# Patient Record
Sex: Female | Born: 1955 | Race: White | Hispanic: No | Smoking: Former smoker
Health system: Southern US, Community
[De-identification: ages and names within clinical notes are randomized; demographics above are authoritative.]

## PROBLEM LIST (undated history)

## (undated) DIAGNOSIS — M1990 Unspecified osteoarthritis, unspecified site: Secondary | ICD-10-CM

## (undated) DIAGNOSIS — N184 Chronic kidney disease, stage 4 (severe): Secondary | ICD-10-CM

## (undated) DIAGNOSIS — G709 Myoneural disorder, unspecified: Secondary | ICD-10-CM

## (undated) DIAGNOSIS — J189 Pneumonia, unspecified organism: Secondary | ICD-10-CM

## (undated) DIAGNOSIS — K219 Gastro-esophageal reflux disease without esophagitis: Secondary | ICD-10-CM

## (undated) DIAGNOSIS — J302 Other seasonal allergic rhinitis: Secondary | ICD-10-CM

## (undated) DIAGNOSIS — G2581 Restless legs syndrome: Secondary | ICD-10-CM

## (undated) DIAGNOSIS — J45909 Unspecified asthma, uncomplicated: Secondary | ICD-10-CM

## (undated) DIAGNOSIS — I209 Angina pectoris, unspecified: Secondary | ICD-10-CM

## (undated) DIAGNOSIS — G4730 Sleep apnea, unspecified: Secondary | ICD-10-CM

## (undated) DIAGNOSIS — G8929 Other chronic pain: Secondary | ICD-10-CM

## (undated) DIAGNOSIS — Z720 Tobacco use: Secondary | ICD-10-CM

## (undated) DIAGNOSIS — E538 Deficiency of other specified B group vitamins: Secondary | ICD-10-CM

## (undated) DIAGNOSIS — R233 Spontaneous ecchymoses: Secondary | ICD-10-CM

## (undated) DIAGNOSIS — B009 Herpesviral infection, unspecified: Secondary | ICD-10-CM

## (undated) DIAGNOSIS — J309 Allergic rhinitis, unspecified: Secondary | ICD-10-CM

## (undated) DIAGNOSIS — IMO0002 Reserved for concepts with insufficient information to code with codable children: Secondary | ICD-10-CM

## (undated) DIAGNOSIS — R238 Other skin changes: Secondary | ICD-10-CM

## (undated) DIAGNOSIS — G4700 Insomnia, unspecified: Secondary | ICD-10-CM

## (undated) DIAGNOSIS — R519 Headache, unspecified: Secondary | ICD-10-CM

## (undated) DIAGNOSIS — Z9981 Dependence on supplemental oxygen: Secondary | ICD-10-CM

## (undated) DIAGNOSIS — I2510 Atherosclerotic heart disease of native coronary artery without angina pectoris: Secondary | ICD-10-CM

## (undated) DIAGNOSIS — J449 Chronic obstructive pulmonary disease, unspecified: Secondary | ICD-10-CM

## (undated) DIAGNOSIS — I10 Essential (primary) hypertension: Secondary | ICD-10-CM

## (undated) DIAGNOSIS — F329 Major depressive disorder, single episode, unspecified: Secondary | ICD-10-CM

## (undated) DIAGNOSIS — I340 Nonrheumatic mitral (valve) insufficiency: Secondary | ICD-10-CM

## (undated) DIAGNOSIS — F32A Depression, unspecified: Secondary | ICD-10-CM

## (undated) DIAGNOSIS — K2270 Barrett's esophagus without dysplasia: Secondary | ICD-10-CM

## (undated) DIAGNOSIS — J42 Unspecified chronic bronchitis: Secondary | ICD-10-CM

## (undated) DIAGNOSIS — F419 Anxiety disorder, unspecified: Secondary | ICD-10-CM

## (undated) DIAGNOSIS — M810 Age-related osteoporosis without current pathological fracture: Secondary | ICD-10-CM

## (undated) DIAGNOSIS — K2970 Gastritis, unspecified, without bleeding: Secondary | ICD-10-CM

## (undated) DIAGNOSIS — E669 Obesity, unspecified: Secondary | ICD-10-CM

## (undated) DIAGNOSIS — D649 Anemia, unspecified: Secondary | ICD-10-CM

## (undated) DIAGNOSIS — R0602 Shortness of breath: Secondary | ICD-10-CM

## (undated) DIAGNOSIS — K5903 Drug induced constipation: Secondary | ICD-10-CM

## (undated) DIAGNOSIS — R51 Headache: Secondary | ICD-10-CM

## (undated) DIAGNOSIS — R9439 Abnormal result of other cardiovascular function study: Secondary | ICD-10-CM

## (undated) HISTORY — PX: BRAIN SURGERY: SHX531

## (undated) HISTORY — PX: TONSILLECTOMY: SUR1361

## (undated) HISTORY — PX: LAPAROSCOPIC CHOLECYSTECTOMY: SUR755

## (undated) HISTORY — PX: ESOPHAGOGASTRODUODENOSCOPY (EGD) WITH ESOPHAGEAL DILATION: SHX5812

## (undated) HISTORY — PX: COLONOSCOPY: SHX174

## (undated) HISTORY — PX: CARPAL TUNNEL RELEASE: SHX101

## (undated) HISTORY — PX: FRACTURE SURGERY: SHX138

## (undated) HISTORY — DX: Essential (primary) hypertension: I10

## (undated) HISTORY — PX: ESOPHAGOGASTRODUODENOSCOPY ENDOSCOPY: SHX5814

## (undated) HISTORY — DX: Abnormal result of other cardiovascular function study: R94.39

## (undated) HISTORY — PX: CYSTOSCOPY VAGINOSCOPY W/ VAGINAL DILATION: SHX1422

## (undated) HISTORY — PX: CATARACT EXTRACTION W/ INTRAOCULAR LENS  IMPLANT, BILATERAL: SHX1307

## (undated) HISTORY — PX: BACK SURGERY: SHX140

---

## 1984-10-21 HISTORY — PX: ABDOMINAL HYSTERECTOMY: SHX81

## 2007-10-22 HISTORY — PX: CARDIAC CATHETERIZATION: SHX172

## 2008-10-21 HISTORY — PX: ANTERIOR CERVICAL DECOMP/DISCECTOMY FUSION: SHX1161

## 2008-10-21 HISTORY — PX: ANKLE FRACTURE SURGERY: SHX122

## 2013-03-26 ENCOUNTER — Other Ambulatory Visit: Payer: Self-pay | Admitting: *Deleted

## 2013-03-26 DIAGNOSIS — Z01818 Encounter for other preprocedural examination: Secondary | ICD-10-CM

## 2013-03-29 ENCOUNTER — Ambulatory Visit (HOSPITAL_COMMUNITY)
Admission: RE | Admit: 2013-03-29 | Discharge: 2013-03-29 | Disposition: A | Discharge status: Home / Self Care | Payer: Medicare Other | Source: Ambulatory Visit | Attending: Cardiovascular Disease | Admitting: Cardiovascular Disease

## 2013-03-29 ENCOUNTER — Encounter (HOSPITAL_COMMUNITY): Payer: Self-pay | Admitting: Cardiology

## 2013-03-29 ENCOUNTER — Encounter (HOSPITAL_COMMUNITY)
Admission: RE | Disposition: A | Discharge status: Home / Self Care | Payer: Self-pay | Source: Ambulatory Visit | Attending: Cardiovascular Disease

## 2013-03-29 DIAGNOSIS — E119 Type 2 diabetes mellitus without complications: Secondary | ICD-10-CM

## 2013-03-29 DIAGNOSIS — Z91018 Allergy to other foods: Secondary | ICD-10-CM | POA: Insufficient documentation

## 2013-03-29 DIAGNOSIS — Z8249 Family history of ischemic heart disease and other diseases of the circulatory system: Secondary | ICD-10-CM

## 2013-03-29 DIAGNOSIS — Z87891 Personal history of nicotine dependence: Secondary | ICD-10-CM

## 2013-03-29 DIAGNOSIS — R0789 Other chest pain: Secondary | ICD-10-CM | POA: Insufficient documentation

## 2013-03-29 DIAGNOSIS — Z883 Allergy status to other anti-infective agents status: Secondary | ICD-10-CM | POA: Insufficient documentation

## 2013-03-29 DIAGNOSIS — Z6835 Body mass index (BMI) 35.0-35.9, adult: Secondary | ICD-10-CM | POA: Insufficient documentation

## 2013-03-29 DIAGNOSIS — R079 Chest pain, unspecified: Secondary | ICD-10-CM

## 2013-03-29 DIAGNOSIS — E669 Obesity, unspecified: Secondary | ICD-10-CM

## 2013-03-29 DIAGNOSIS — Z79899 Other long term (current) drug therapy: Secondary | ICD-10-CM | POA: Insufficient documentation

## 2013-03-29 DIAGNOSIS — Z01818 Encounter for other preprocedural examination: Secondary | ICD-10-CM

## 2013-03-29 DIAGNOSIS — I200 Unstable angina: Secondary | ICD-10-CM

## 2013-03-29 HISTORY — PX: LEFT HEART CATHETERIZATION WITH CORONARY ANGIOGRAM: SHX5451

## 2013-03-29 LAB — CBC
HCT: 37.9 % (ref 36.0–46.0)
Hemoglobin: 12.6 g/dL (ref 12.0–15.0)
MCH: 29.6 pg (ref 26.0–34.0)
MCV: 89 fL (ref 78.0–100.0)
RBC: 4.26 MIL/uL (ref 3.87–5.11)

## 2013-03-29 LAB — BASIC METABOLIC PANEL
CO2: 27 mEq/L (ref 19–32)
Chloride: 103 mEq/L (ref 96–112)
GFR calc non Af Amer: >90 mL/min (ref >90)
Glucose, Bld: 97 mg/dL (ref 70–99)
Potassium: 3.9 mEq/L (ref 3.5–5.1)
Sodium: 138 mEq/L (ref 135–145)

## 2013-03-29 LAB — PROTIME-INR: Prothrombin Time: 11.7 seconds (ref 11.6–15.2)

## 2013-03-29 SURGERY — LEFT HEART CATHETERIZATION WITH CORONARY ANGIOGRAM
Anesthesia: LOCAL

## 2013-03-29 MED ORDER — HEPARIN (PORCINE) IN NACL 2-0.9 UNIT/ML-% IJ SOLN
INTRAMUSCULAR | Status: AC
  Filled 2013-03-29: qty 1000

## 2013-03-29 MED ORDER — HYDRALAZINE HCL 20 MG/ML IJ SOLN
INTRAMUSCULAR | Status: AC
  Filled 2013-03-29: qty 1

## 2013-03-29 MED ORDER — SODIUM CHLORIDE 0.9 % IV SOLN: INTRAVENOUS | Status: AC

## 2013-03-29 MED ORDER — ONDANSETRON HCL 4 MG/2ML IJ SOLN: 4.0000 mg | Freq: Four times a day (QID) | INTRAMUSCULAR | Status: DC | PRN

## 2013-03-29 MED ORDER — ASPIRIN 81 MG PO CHEW
CHEWABLE_TABLET | ORAL | Status: AC
  Filled 2013-03-29: qty 4

## 2013-03-29 MED ORDER — SODIUM CHLORIDE 0.9 % IJ SOLN: 3.0000 mL | INTRAMUSCULAR | Status: DC | PRN

## 2013-03-29 MED ORDER — HYDRALAZINE HCL 20 MG/ML IJ SOLN: 10.0000 mg | INTRAMUSCULAR | Status: DC

## 2013-03-29 MED ORDER — DIAZEPAM 5 MG PO TABS
5.0000 mg | ORAL_TABLET | ORAL | Status: AC
  Administered 2013-03-29: 5 mg via ORAL

## 2013-03-29 MED ORDER — LIDOCAINE HCL (PF) 1 % IJ SOLN
INTRAMUSCULAR | Status: AC
  Filled 2013-03-29: qty 30

## 2013-03-29 MED ORDER — SODIUM CHLORIDE 0.9 % IV SOLN
INTRAVENOUS | Status: DC
  Administered 2013-03-29: 13:00:00 via INTRAVENOUS

## 2013-03-29 MED ORDER — MORPHINE SULFATE 2 MG/ML IJ SOLN
1.0000 mg | INTRAMUSCULAR | Status: DC | PRN
  Administered 2013-03-29: 1 mg via INTRAVENOUS

## 2013-03-29 MED ORDER — DIAZEPAM 5 MG PO TABS
ORAL_TABLET | ORAL | Status: AC
  Filled 2013-03-29: qty 1

## 2013-03-29 MED ORDER — ACETAMINOPHEN 325 MG PO TABS: 650.0000 mg | ORAL_TABLET | ORAL | Status: DC | PRN

## 2013-03-29 MED ORDER — MORPHINE SULFATE 2 MG/ML IJ SOLN
INTRAMUSCULAR | Status: AC
  Filled 2013-03-29: qty 1

## 2013-03-29 NOTE — CV Procedure (Signed)
Shannon Morse is a 57 y.o. female    [id number] LOCATION:  FACILITY: [facility]  PHYSICIAN: Nanetta Batty, M.D. Feb 14, 1956   DATE OF PROCEDURE:  03/29/2013  DATE OF DISCHARGE:   CARDIAC CATHETERIZATION     History obtained from chart review.the patient is a 57 y/o Caucasian female, followed at [facility] by Dr. Hanley Hays, who was referred to [facility] for a diagnostic heat catheterization, in the setting of unstable angina. Her medical history is significant for obesity, borderline diabetes and a 40 year history of heavy tobacco use. The patient quit 1 year ago and before quitting, she was up to 5 ppd. She also has a strong family history of CAD. Both parents had MIs late in age, and she has one sister who had a MI in her early 67s. The patient reports that for the past 2 months she has had substernal chest pain that wakes her from her sleep. The pain is pressure like with radiation to her back. The episodes sometimes last up to 1 hour in duration. She has 2-3 episodes, on average, per week. She also notes exertional shortness of breath.     PROCEDURE DESCRIPTION:    The patient was brought to the second floor [place] [facility]iac cath lab in the postabsorptive state. She waspremedicated with valium 5 mg PO. Her right groin  was prepped and shaved in usual sterile fashion. Xylocaine 1% was used for local anesthesia. A 5 French sheath was inserted into the RCFA  artery using standard Seldinger technique. 5 french right and left Judkins diagnostic catheters were used as well as a 5 french pigtail. Visapaque dye was used for the entirety of the case. Retrograde aortic, left ventricular and pull back pressures were recorded.  HEMODYNAMICS:    AO SYSTOLIC/AO DIASTOLIC: 174/85   LV SYSTOLIC/LV DIASTOLIC: 173/12  ANGIOGRAPHIC RESULTS:   1. Left main; nl  2. LAD; nl 3. Left circumflex; nl.  4. Right coronary artery; nl 5. Left ventriculography; RAO left ventriculogram was performed using  25  mL of Visipaque dye at 12 mL/second. The overall LVEF estimated  60 %  Without wall motion abnormalities  IMPRESSION:Nl cath. Non cardiac CP. MYNX closure. Home today as an OP. ROV with Dr. Leamon Arnt.  Runell Gess MD, [facility] 03/29/2013 3:23 PM

## 2013-03-29 NOTE — H&P (Signed)
   Pt was reexamined and existing H & P reviewed. No changes found.  Runell Gess, MD [facility] 03/29/2013 2:35 PM

## 2013-03-29 NOTE — H&P (Signed)
Chief Complaint: Unstable Angina  HPI: the patient is a 57 y/o Caucasian female, followed at [facility] by Dr. Hanley Hays, who was referred to [facility] for a diagnostic heat catheterization, in the setting of unstable angina. Her medical history is significant for obesity, borderline diabetes and a 40 year history of heavy tobacco use. The patient quit 1 year ago and before quitting, she was up to 5 ppd. She also has a strong family history of CAD. Both parents had MIs late in age, and she has one sister who had a MI in her early 37s. The patient reports that for the past 2 months she has had substernal chest pain that wakes her from her sleep. The pain is pressure like with radiation to her back. The episodes sometimes last up to 1 hour in duration. She has 2-3 episodes, on average, per week. She also notes exertional shortness of breath. No other associated symptoms. She denies orthopnea, PND, syncope/presyncope. No recent fever, chills, n/v, melena, hematochezia or hematuria. She is currently CP free.   No past medical history on file.  No past surgical history on file.  Family History  Problem Relation Age of Onset  . Coronary artery disease Mother 46    MI  . Coronary artery disease Father     MI  . Coronary artery disease Sister 38    MI  . Heart failure Father    Social History:  reports that she has quit smoking. Her smoking use included Cigarettes. She smoked 0.00 packs per day for 40 years. She does not have any smokeless tobacco history on file. Her alcohol and drug histories are not on file.  Allergies:  Allergies  Allergen Reactions  . Avelox (Moxifloxacin)   . Okra   . Strawberry     Medications Prior to Admission  Medication Sig Dispense Refill  . albuterol (PROVENTIL HFA;VENTOLIN HFA) 108 (90 BASE) MCG/ACT inhaler Inhale 2 puffs into the lungs every 6 (six) hours as needed for wheezing or shortness of breath.      Marland Kitchen albuterol (PROVENTIL) (2.5 MG/3ML) 0.083% nebulizer  solution Take 2.5 mg by nebulization every 6 (six) hours as needed for wheezing or shortness of breath.      . ALPRAZolam (XANAX) 1 MG tablet Take 1 mg by mouth 4 (four) times daily as needed for anxiety.      . budesonide-formoterol (SYMBICORT) 160-4.5 MCG/ACT inhaler Inhale 2 puffs into the lungs 2 (two) times daily.      . Cholecalciferol (VITAMIN D PO) Take 1 tablet by mouth daily.      . Cyanocobalamin (VITAMIN B-12 PO) Take 1 tablet by mouth daily.      Marland Kitchen dexlansoprazole (DEXILANT) 60 MG capsule Take 60 mg by mouth 2 (two) times daily.      Marland Kitchen gabapentin (NEURONTIN) 300 MG capsule Take 600 mg by mouth at bedtime.      Marland Kitchen oxyCODONE (ROXICODONE) 15 MG immediate release tablet Take 15 mg by mouth every 6 (six) hours as needed for pain.      . OxyCODONE HCl ER (OXYCONTIN) 60 MG T12A Take 60 mg by mouth 3 (three) times daily.      . pramipexole (MIRAPEX) 0.75 MG tablet Take 0.75 mg by mouth at bedtime.        Results for orders placed during the hospital encounter of 03/29/13 (from the past 48 hour(s))  CBC     Status: None   Collection Time    03/29/13 12:44 PM  Result Value Range   WBC 6.8  4.0 - 10.5 K/uL   RBC 4.26  3.87 - 5.11 MIL/uL   Hemoglobin 12.6  12.0 - 15.0 g/dL   HCT 16.1  09.6 - 04.5 %   MCV 89.0  78.0 - 100.0 fL   MCH 29.6  26.0 - 34.0 pg   MCHC 33.2  30.0 - 36.0 g/dL   RDW 40.9  81.1 - 91.4 %   Platelets 269  150 - 400 K/uL  PROTIME-INR     Status: None   Collection Time    03/29/13 12:44 PM      Result Value Range   Prothrombin Time 11.7  11.6 - 15.2 seconds   INR 0.86  0.00 - 1.49   No results found.  Review of Systems  Constitutional: Negative for fever, chills, malaise/fatigue and diaphoresis.  HENT: Negative for ear pain, congestion, sore throat and neck pain. Tinnitus: chronic.   Eyes: Negative for blurred vision, double vision and photophobia.  Respiratory: Positive for shortness of breath (only on exertion) and wheezing. Negative for cough.    Cardiovascular: Positive for chest pain. Negative for palpitations, orthopnea, claudication, leg swelling and PND.  Gastrointestinal: Negative for nausea, vomiting, abdominal pain, diarrhea, constipation, blood in stool and melena.  Genitourinary: Negative for dysuria, urgency, frequency and hematuria.  Musculoskeletal: Positive for back pain. Negative for myalgias and falls.  Skin: Negative for itching and rash.  Neurological: Positive for headaches. Negative for dizziness, focal weakness, seizures, loss of consciousness and weakness.    Blood pressure 140/74, pulse 84, temperature 98.7 F (37.1 C), temperature source Oral, resp. rate 18, height 5\' 4"  (1.626 m), weight 205 lb (92.987 kg), SpO2 92.00%. Physical Exam  Constitutional: She is oriented to person, place, and time. She appears well-developed and well-nourished. No distress.  HENT:  Head: Normocephalic and atraumatic.  Eyes: Conjunctivae are normal. Pupils are equal, round, and reactive to light.  Neck: No JVD present. Carotid bruit is not present. No tracheal deviation present. No thyromegaly present.  Cardiovascular: Normal rate, regular rhythm, normal heart sounds and intact distal pulses.  Exam reveals no gallop and no friction rub.   No murmur heard. Pulses:      Radial pulses are 2+ on the right side, and 2+ on the left side.       Dorsalis pedis pulses are 1+ on the right side, and 1+ on the left side.  Respiratory: Effort normal. No respiratory distress. She has wheezes (bilateral expiratory). She has no rales. She exhibits no tenderness.  GI: Soft. Bowel sounds are normal. She exhibits no distension and no mass. There is no tenderness.  Musculoskeletal: Normal range of motion. She exhibits no edema.  Lymphadenopathy:    She has no cervical adenopathy.  Neurological: She is alert and oriented to person, place, and time.  Skin: Skin is warm and dry. She is not diaphoretic.  Psychiatric: She has a normal mood and affect.  Her behavior is normal.     Assessment/Plan Principal Problem:   Unstable angina Active Problems:   DM (diabetes mellitus)   Obesity   History of tobacco abuse  Plan: Pt presents for elective OP cardiac cath. Exam benign. Vitals stable. Proceed with cath to be performed by Dr. Allyson Sabal.   Allayne Butcher, PA-C 03/29/2013, 1:40 PM  Agree with note written by Boyce Medici  PAC  + CRF, symptoms c/w [place]. Referred for cath.  Runell Gess 03/29/2013 3:22 PM

## 2013-03-30 LAB — GLUCOSE, CAPILLARY: Glucose-Capillary: 79 mg/dL (ref 70–99)

## 2013-07-13 ENCOUNTER — Encounter: Payer: Self-pay | Admitting: Cardiovascular Disease

## 2013-07-13 ENCOUNTER — Other Ambulatory Visit: Payer: Self-pay | Admitting: *Deleted

## 2013-07-13 DIAGNOSIS — I059 Rheumatic mitral valve disease, unspecified: Secondary | ICD-10-CM

## 2013-07-19 ENCOUNTER — Encounter (HOSPITAL_COMMUNITY)
Admission: RE | Disposition: A | Discharge status: Home / Self Care | Payer: Self-pay | Source: Ambulatory Visit | Attending: Cardiovascular Disease

## 2013-07-19 ENCOUNTER — Ambulatory Visit (HOSPITAL_COMMUNITY)
Admission: RE | Admit: 2013-07-19 | Discharge: 2013-07-19 | Disposition: A | Discharge status: Home / Self Care | Payer: Medicare Other | Source: Ambulatory Visit | Attending: Cardiovascular Disease | Admitting: Cardiovascular Disease

## 2013-07-19 ENCOUNTER — Encounter (HOSPITAL_COMMUNITY): Payer: Self-pay | Admitting: *Deleted

## 2013-07-19 DIAGNOSIS — E669 Obesity, unspecified: Secondary | ICD-10-CM | POA: Insufficient documentation

## 2013-07-19 DIAGNOSIS — Z79899 Other long term (current) drug therapy: Secondary | ICD-10-CM | POA: Insufficient documentation

## 2013-07-19 DIAGNOSIS — E119 Type 2 diabetes mellitus without complications: Secondary | ICD-10-CM | POA: Insufficient documentation

## 2013-07-19 DIAGNOSIS — R0609 Other forms of dyspnea: Secondary | ICD-10-CM | POA: Insufficient documentation

## 2013-07-19 DIAGNOSIS — Z8249 Family history of ischemic heart disease and other diseases of the circulatory system: Secondary | ICD-10-CM

## 2013-07-19 DIAGNOSIS — Z87891 Personal history of nicotine dependence: Secondary | ICD-10-CM | POA: Insufficient documentation

## 2013-07-19 DIAGNOSIS — I059 Rheumatic mitral valve disease, unspecified: Secondary | ICD-10-CM

## 2013-07-19 DIAGNOSIS — R0989 Other specified symptoms and signs involving the circulatory and respiratory systems: Secondary | ICD-10-CM | POA: Insufficient documentation

## 2013-07-19 HISTORY — DX: Nonrheumatic mitral (valve) insufficiency: I34.0

## 2013-07-19 HISTORY — DX: Restless legs syndrome: G25.81

## 2013-07-19 HISTORY — DX: Gastritis, unspecified, without bleeding: K29.70

## 2013-07-19 HISTORY — DX: Chronic obstructive pulmonary disease, unspecified: J44.9

## 2013-07-19 HISTORY — DX: Other chronic pain: G89.29

## 2013-07-19 HISTORY — DX: Deficiency of other specified B group vitamins: E53.8

## 2013-07-19 HISTORY — DX: Unspecified osteoarthritis, unspecified site: M19.90

## 2013-07-19 HISTORY — DX: Herpesviral infection, unspecified: B00.9

## 2013-07-19 HISTORY — DX: Tobacco use: Z72.0

## 2013-07-19 HISTORY — DX: Atherosclerotic heart disease of native coronary artery without angina pectoris: I25.10

## 2013-07-19 HISTORY — DX: Age-related osteoporosis without current pathological fracture: M81.0

## 2013-07-19 HISTORY — DX: Major depressive disorder, single episode, unspecified: F32.9

## 2013-07-19 HISTORY — DX: Angina pectoris, unspecified: I20.9

## 2013-07-19 HISTORY — DX: Sleep apnea, unspecified: G47.30

## 2013-07-19 HISTORY — DX: Depression, unspecified: F32.A

## 2013-07-19 HISTORY — DX: Insomnia, unspecified: G47.00

## 2013-07-19 HISTORY — DX: Reserved for concepts with insufficient information to code with codable children: IMO0002

## 2013-07-19 HISTORY — DX: Anemia, unspecified: D64.9

## 2013-07-19 HISTORY — DX: Allergic rhinitis, unspecified: J30.9

## 2013-07-19 HISTORY — PX: TEE WITHOUT CARDIOVERSION: SHX5443

## 2013-07-19 HISTORY — DX: Obesity, unspecified: E66.9

## 2013-07-19 HISTORY — DX: Gastro-esophageal reflux disease without esophagitis: K21.9

## 2013-07-19 HISTORY — DX: Pneumonia, unspecified organism: J18.9

## 2013-07-19 HISTORY — DX: Anxiety disorder, unspecified: F41.9

## 2013-07-19 HISTORY — DX: Barrett's esophagus without dysplasia: K22.70

## 2013-07-19 SURGERY — ECHOCARDIOGRAM, TRANSESOPHAGEAL
Anesthesia: Moderate Sedation

## 2013-07-19 MED ORDER — BUTAMBEN-TETRACAINE-BENZOCAINE 2-2-14 % EX AERO
INHALATION_SPRAY | CUTANEOUS | Status: DC | PRN
  Administered 2013-07-19: 2 via TOPICAL

## 2013-07-19 MED ORDER — MIDAZOLAM HCL 10 MG/2ML IJ SOLN
INTRAMUSCULAR | Status: DC | PRN
  Administered 2013-07-19: 2 mg via INTRAVENOUS
  Administered 2013-07-19: 1 mg via INTRAVENOUS

## 2013-07-19 MED ORDER — FENTANYL CITRATE 0.05 MG/ML IJ SOLN
INTRAMUSCULAR | Status: DC | PRN
  Administered 2013-07-19 (×2): 25 ug via INTRAVENOUS

## 2013-07-19 MED ORDER — MIDAZOLAM HCL 5 MG/ML IJ SOLN
INTRAMUSCULAR | Status: AC
  Filled 2013-07-19: qty 2

## 2013-07-19 MED ORDER — FENTANYL CITRATE 0.05 MG/ML IJ SOLN
INTRAMUSCULAR | Status: AC
  Filled 2013-07-19: qty 2

## 2013-07-19 MED ORDER — SODIUM CHLORIDE 0.9 % IV SOLN
INTRAVENOUS | Status: DC
  Administered 2013-07-19: 13:00:00 via INTRAVENOUS

## 2013-07-19 NOTE — H&P (Signed)
HPI: the patient is a 57 y/o Caucasian female, followed at [facility] by Dr. Hanley Hays, who was referred to [facility] for a diagnostic heart catheterization in June, in the setting of unstable angina, but had normal coronaries. She has unexplained dyspnea and is referred for TEE for mitral insufficiency evaluation.. Her medical history is significant for obesity, borderline diabetes and a 40 year history of heavy tobacco use. The patient quit 1 year ago and before quitting, she was up to 5 ppd. She also has a strong family history of CAD. Both parents had MIs late in age, and she has one sister who had a MI in her early 39s. The patient reports that for the past 2 months she has had substernal chest pain that wakes her from her sleep. She also notes exertional shortness of breath. No other associated symptoms. She denies orthopnea, PND, syncope/presyncope. No recent fever, chills, n/v, melena, hematochezia or hematuria. She is currently CP free.  No other past medical history.  No other past surgical history.  Family History   Problem  Relation  Age of Onset   .  Coronary artery disease  Mother  67     MI   .  Coronary artery disease  Father      MI   .  Coronary artery disease  Sister  2     MI   .  Heart failure  Father    Social History: reports that she has quit smoking. Her smoking use included Cigarettes. She smoked 0.00 packs per day for 40 years. She does not have any smokeless tobacco history on file. Her alcohol and drug histories are not on file.  Allergies:  Allergies   Allergen  Reactions   .  Avelox (Moxifloxacin)    .  Okra    .  Strawberry     Medications Prior to Admission   Medication  Sig  Dispense  Refill   .  albuterol (PROVENTIL HFA;VENTOLIN HFA) 108 (90 BASE) MCG/ACT inhaler  Inhale 2 puffs into the lungs every 6 (six) hours as needed for wheezing or shortness of breath.     Marland Kitchen  albuterol (PROVENTIL) (2.5 MG/3ML) 0.083% nebulizer solution  Take 2.5 mg by nebulization every  6 (six) hours as needed for wheezing or shortness of breath.     .  ALPRAZolam (XANAX) 1 MG tablet  Take 1 mg by mouth 4 (four) times daily as needed for anxiety.     .  budesonide-formoterol (SYMBICORT) 160-4.5 MCG/ACT inhaler  Inhale 2 puffs into the lungs 2 (two) times daily.     .  Cholecalciferol (VITAMIN D PO)  Take 1 tablet by mouth daily.     .  Cyanocobalamin (VITAMIN B-12 PO)  Take 1 tablet by mouth daily.     Marland Kitchen  dexlansoprazole (DEXILANT) 60 MG capsule  Take 60 mg by mouth 2 (two) times daily.     Marland Kitchen  gabapentin (NEURONTIN) 300 MG capsule  Take 600 mg by mouth at bedtime.     Marland Kitchen  oxyCODONE (ROXICODONE) 15 MG immediate release tablet  Take 15 mg by mouth every 6 (six) hours as needed for pain.     .  OxyCODONE HCl ER (OXYCONTIN) 60 MG T12A  Take 60 mg by mouth 3 (three) times daily.     .  pramipexole (MIRAPEX) 0.75 MG tablet  Take 0.75 mg by mouth at bedtime.      Results for orders placed during the hospital encounter of 03/29/13 (  from the past 48 hour(s))   CBC Status: None    Collection Time    03/29/13 12:44 PM   Result  Value  Range    WBC  6.8  4.0 - 10.5 K/uL    RBC  4.26  3.87 - 5.11 MIL/uL    Hemoglobin  12.6  12.0 - 15.0 g/dL    HCT  16.1  09.6 - 04.5 %    MCV  89.0  78.0 - 100.0 fL    MCH  29.6  26.0 - 34.0 pg    MCHC  33.2  30.0 - 36.0 g/dL    RDW  40.9  81.1 - 91.4 %    Platelets  269  150 - 400 K/uL   PROTIME-INR Status: None    Collection Time    03/29/13 12:44 PM   Result  Value  Range    Prothrombin Time  11.7  11.6 - 15.2 seconds    INR  0.86  0.00 - 1.49   No results found.  Review of Systems  Constitutional: Negative for fever, chills, malaise/fatigue and diaphoresis.  HENT: Negative for ear pain, congestion, sore throat and neck pain. Tinnitus: chronic.  Eyes: Negative for blurred vision, double vision and photophobia.  Respiratory: Positive for shortness of breath (only on exertion) and wheezing. Negative for cough.  Cardiovascular: Positive for  chest pain. Negative for palpitations, orthopnea, claudication, leg swelling and PND.  Gastrointestinal: Negative for nausea, vomiting, abdominal pain, diarrhea, constipation, blood in stool and melena.  Genitourinary: Negative for dysuria, urgency, frequency and hematuria.  Musculoskeletal: Positive for back pain. Negative for myalgias and falls.  Skin: Negative for itching and rash.  Neurological: Positive for headaches. Negative for dizziness, focal weakness, seizures, loss of consciousness and weakness.  Blood pressure 140/74, pulse 84, temperature 98.7 F (37.1 C), temperature source Oral, resp. rate 18, height 5\' 4"  (1.626 m), weight 205 lb (92.987 kg), SpO2 92.00%.  Physical Exam  Constitutional: She is oriented to person, place, and time. She appears well-developed and well-nourished. No distress.  HENT:  Head: Normocephalic and atraumatic.  Eyes: Conjunctivae are normal. Pupils are equal, round, and reactive to light.  Neck: No JVD present. Carotid bruit is not present. No tracheal deviation present. No thyromegaly present.  Cardiovascular: Normal rate, regular rhythm, normal heart sounds and intact distal pulses. Exam reveals no gallop and no friction rub.  No murmur heard.  Pulses:  Radial pulses are 2+ on the right side, and 2+ on the left side.  Dorsalis pedis pulses are 1+ on the right side, and 1+ on the left side.  Respiratory: Effort normal. No respiratory distress. She has wheezes (bilateral expiratory). She has no rales. She exhibits no tenderness.  GI: Soft. Bowel sounds are normal. She exhibits no distension and no mass. There is no tenderness.  Musculoskeletal: Normal range of motion. She exhibits no edema.  Lymphadenopathy:  She has no cervical adenopathy.  Neurological: She is alert and oriented to person, place, and time.  Skin: Skin is warm and dry. She is not diaphoretic.  Psychiatric: She has a normal mood and affect. Her behavior is normal.  Assessment/Plan   Principal Problem:  Mitral insufficiency Active Problems:  DM (diabetes mellitus)  Obesity  History of tobacco abuse  Plan: TEE This procedure has been fully reviewed with the patient and written informed consent has been obtained.   Runell Gess  03/29/2013  3:22 PM

## 2013-07-19 NOTE — Progress Notes (Signed)
*  PRELIMINARY RESULTS* Echocardiogram TEE has been performed.  Jeryl Columbia 07/19/2013, 2:20 PM

## 2013-07-19 NOTE — Op Note (Signed)
INDICATIONS: mitral insufficiency  PROCEDURE:   Informed consent was obtained prior to the procedure. The risks, benefits and alternatives for the procedure were discussed and the patient comprehended these risks.  Risks include, but are not limited to, cough, sore throat, vomiting, nausea, somnolence, esophageal and stomach trauma or perforation, bleeding, low blood pressure, aspiration, pneumonia, infection, trauma to the teeth and death.    After a procedural time-out, the oropharynx was anesthetized with 20% benzocaine spray. The patient was given 3 mg versed and 50 mcg fentanyl for moderate sedation.   The transesophageal probe was inserted in the esophagus and stomach without difficulty and multiple views were obtained.  The patient was kept under observation until the patient left the procedure room.  The patient left the procedure room in stable condition.   Agitated microbubble saline contrast was not administered.  COMPLICATIONS:    There were no immediate complications.  FINDINGS:  Moderate MR, central jet, ERO 0.15 cm sq. Otherwise unremarkable study, normal LVEF   RECOMMENDATIONS:   F/U with Dr. Hanley Hays  Time Spent Directly with the Patient:  30 minutes   Shannon Morse 07/19/2013, 1:54 PM

## 2013-07-20 ENCOUNTER — Encounter (HOSPITAL_COMMUNITY): Payer: Self-pay | Admitting: Cardiovascular Disease

## 2013-09-15 ENCOUNTER — Other Ambulatory Visit: Payer: Self-pay | Admitting: Neurosurgery

## 2013-09-22 ENCOUNTER — Encounter (HOSPITAL_COMMUNITY): Payer: Self-pay | Admitting: Pharmacy Technician

## 2013-09-23 NOTE — Pre-Procedure Instructions (Signed)
Shannon Morse  09/23/2013   Your procedure is scheduled on:  Tuesday September 28, 2013.  Report to [facility] Short Stay Entrance "A"  Admitting at 5:30 AM.  Call this number if you have problems the morning of surgery: [phone]   Remember:   Do not eat food or drink liquids after midnight.   Take these medicines the morning of surgery with A SIP OF WATER: Albuterol inhaler/Nebulizer if needed for shortness of breath, Alprazolam (Xanax) if needed for anxiety, Symbicort Inhaler, Buspirone (Buspar), Dexlansoprazole (Dexilant), Flonase nasal spray, Montelukast (Singular), Oxycodone if needed for pain, Oxycodone (Oxycontin), and Spiriva inhaler.   Do not wear jewelry, make-up or nail polish.  Do not wear lotions, powders, or perfumes. You may wear deodorant.  Do not shave 48 hours prior to surgery.  Do not bring valuables to the hospital.  [facility] is not responsible for any belongings or valuables.               Contacts, dentures or bridgework may not be worn into surgery.  Leave suitcase in the car. After surgery it may be brought to your room.  For patients admitted to the hospital, discharge time is determined by your treatment team.               Patients discharged the day of surgery will not be allowed to drive home.  Name and phone number of your driver: Family/Friend  Special Instructions: Shower using CHG 2 nights before surgery and the night before surgery.  If you shower the day of surgery use CHG.  Use special wash - you have one bottle of CHG for all showers.  You should use approximately 1/3 of the bottle for each shower.   Please read over the following fact sheets that you were given: Pain Booklet, Coughing and Deep Breathing, MRSA Information and Surgical Site Infection Prevention

## 2013-09-24 ENCOUNTER — Ambulatory Visit (HOSPITAL_COMMUNITY)
Admission: RE | Admit: 2013-09-24 | Discharge: 2013-09-24 | Disposition: A | Discharge status: Home / Self Care | Payer: Medicare Other | Source: Ambulatory Visit | Attending: Neurosurgery | Admitting: Neurosurgery

## 2013-09-24 ENCOUNTER — Encounter (HOSPITAL_COMMUNITY): Payer: Self-pay

## 2013-09-24 ENCOUNTER — Encounter (HOSPITAL_COMMUNITY)
Admission: RE | Admit: 2013-09-24 | Discharge: 2013-09-24 | Disposition: A | Discharge status: Home / Self Care | Payer: Medicare Other | Source: Ambulatory Visit | Attending: Neurosurgery | Admitting: Neurosurgery

## 2013-09-24 DIAGNOSIS — Z01818 Encounter for other preprocedural examination: Secondary | ICD-10-CM | POA: Insufficient documentation

## 2013-09-24 DIAGNOSIS — Z87891 Personal history of nicotine dependence: Secondary | ICD-10-CM | POA: Insufficient documentation

## 2013-09-24 DIAGNOSIS — J4489 Other specified chronic obstructive pulmonary disease: Secondary | ICD-10-CM | POA: Insufficient documentation

## 2013-09-24 DIAGNOSIS — Z01812 Encounter for preprocedural laboratory examination: Secondary | ICD-10-CM | POA: Insufficient documentation

## 2013-09-24 DIAGNOSIS — J40 Bronchitis, not specified as acute or chronic: Secondary | ICD-10-CM | POA: Insufficient documentation

## 2013-09-24 DIAGNOSIS — J449 Chronic obstructive pulmonary disease, unspecified: Secondary | ICD-10-CM | POA: Insufficient documentation

## 2013-09-24 HISTORY — DX: Shortness of breath: R06.02

## 2013-09-24 HISTORY — DX: Other skin changes: R23.8

## 2013-09-24 HISTORY — DX: Spontaneous ecchymoses: R23.3

## 2013-09-24 HISTORY — DX: Drug induced constipation: K59.03

## 2013-09-24 HISTORY — DX: Other seasonal allergic rhinitis: J30.2

## 2013-09-24 HISTORY — DX: Dependence on supplemental oxygen: Z99.81

## 2013-09-24 LAB — SURGICAL PCR SCREEN
MRSA, PCR: NEGATIVE
Staphylococcus aureus: NEGATIVE

## 2013-09-24 LAB — CBC
Hemoglobin: 11.6 g/dL — ABNORMAL LOW (ref 12.0–15.0)
MCH: 30.1 pg (ref 26.0–34.0)
RBC: 3.86 MIL/uL — ABNORMAL LOW (ref 3.87–5.11)
WBC: 5.5 10*3/uL (ref 4.0–10.5)

## 2013-09-24 LAB — BASIC METABOLIC PANEL
CO2: 33 mEq/L — ABNORMAL HIGH (ref 19–32)
Chloride: 100 mEq/L (ref 96–112)
GFR calc non Af Amer: >90 mL/min (ref >90)
Glucose, Bld: 105 mg/dL — ABNORMAL HIGH (ref 70–99)
Potassium: 3.5 mEq/L (ref 3.5–5.1)
Sodium: 141 mEq/L (ref 135–145)

## 2013-09-24 NOTE — Progress Notes (Signed)
Patient informed Nurse that she had a stress test with her Cardiologist Dr. Lollie Marrow at [facility]. Records requested. Cardiac cath results in EPIC from 03/29/13. Pulmonary Physician is Dr. Eulis Foster also at [facility]. Patient informed Nurse that her LOV was yesterday and she had PFT's done. Records also requested. PCP is Dr. Domingo Pulse also at [facility]. Patient denied having any acute cardiac issues, but stated "I get short of breath when I'm walking so sometimes I have to take my inhaler." Patient informed Nurse that she used her rescue inhaler today due to the long walk into the hospital, however she denied any shortness of breath during PAT visit.

## 2013-09-27 ENCOUNTER — Encounter (HOSPITAL_COMMUNITY): Payer: Self-pay

## 2013-09-27 MED ORDER — CEFAZOLIN SODIUM-DEXTROSE 2-3 GM-% IV SOLR
2.0000 g | INTRAVENOUS | Status: AC
  Administered 2013-09-28: 2 g via INTRAVENOUS
  Filled 2013-09-27: qty 50

## 2013-09-27 NOTE — Progress Notes (Signed)
Anesthesia chart review:  Patient is a 57 year old female scheduled for C4-5, C6-7 ACDF on 09/28/13 by Dr. Gerlene Fee.  History includes former smoker (quit ~ 2013), COPD, night time home oxygen, OSA without CPAP use, chest pain with history of normal coronaries by cath on 03/29/13, mild to moderate mitral regurgitation by 06/2013 echo, obesity, chronic DOE, GERD, Barrett's esophagus, gastritis, anemia, RLS, HTN, anxiety, osteoporosis. Pulmonologist is Dr. Eulis Foster, records requested on 09/24/13 and this afternoon but are currently pending. Primary cardiologist is Dr. Lollie Marrow at [facility].  He referred patient to Dr. Nanetta Batty for cardiac cath earlier this year (see below).  Cardiac cath on 03/29/13 showed normal coronaries, estimated LVEF 60% without wall motion abnormalities.     Echo on 07/19/13 showed: - Left ventricle: The cavity size was normal. Wall thickness was normal. Systolic function was normal. The estimated ejection fraction was in the range of 55% to 60%. Wall motion was normal; there were no regional wall motion abnormalities. - Aortic valve: No evidence of vegetation. Trivial regurgitation. - Mitral valve: Mild to moderate regurgitation directed centrally. - Left atrium: No evidence of thrombus in the atrial cavity or appendage. No spontaneous echo contrast was observed. - Right atrium: No evidence of thrombus in the atrial cavity or appendage. - Atrial septum: No defect or patent foramen ovale was identified. Echo contrast study showed no right-to-left atrial level shunt, following an increase in RA pressure induced by provocative maneuvers. - Tricuspid valve: No evidence of vegetation. - Pulmonic valve: No evidence of vegetation.  EKG on 03/29/13 showed NSR.  CXR on 09/24/13 showed:  1. Lung hyperexpansion and bronchitic change without acute cardiopulmonary disease.  2. Left basilar/retrocardiac heterogeneous opacities are favored to represent a combination of  atelectasis/scar and a prominent left sided epicardial fat pad. No discrete focal airspace opacities.   According to 03/2013 notes by Dr. Hanley Hays, "PFTs and pulmonary evaluation [for dyspnea] have been unremarkable."  Apparently, she had repeat PFTs on 09/23/13 which we requested be faxed to PAT if results are available by tomorrow.    Preoperative labs noted.  As of 03/2013 pulmonary evaluation was "unremarkable."  Cardiac cath was WNL earlier this year. MR is only mild to moderate at this time. If no acute change in her cardiopulmonary status then I would anticipate that she could proceed as planned.  Velna Ochs [facility] Short Stay Center/Anesthesiology Phone [phone] 09/27/2013 4:25 PM

## 2013-09-28 ENCOUNTER — Encounter (HOSPITAL_COMMUNITY): Payer: Self-pay | Admitting: Anesthesiology

## 2013-09-28 ENCOUNTER — Inpatient Hospital Stay (HOSPITAL_COMMUNITY): Payer: Medicare Other | Admitting: Anesthesiology

## 2013-09-28 ENCOUNTER — Encounter (HOSPITAL_COMMUNITY): Payer: Medicare Other | Admitting: Vascular Surgery

## 2013-09-28 ENCOUNTER — Inpatient Hospital Stay (HOSPITAL_COMMUNITY): Payer: Medicare Other

## 2013-09-28 ENCOUNTER — Inpatient Hospital Stay (HOSPITAL_COMMUNITY)
Admission: RE | Admit: 2013-09-28 | Discharge: 2013-09-29 | DRG: 473 | Disposition: A | Discharge status: Home / Self Care | Payer: Medicare Other | Source: Ambulatory Visit | Attending: Neurosurgery | Admitting: Neurosurgery

## 2013-09-28 ENCOUNTER — Encounter (HOSPITAL_COMMUNITY)
Admission: RE | Disposition: A | Discharge status: Home / Self Care | Payer: Self-pay | Source: Ambulatory Visit | Attending: Neurosurgery

## 2013-09-28 DIAGNOSIS — F329 Major depressive disorder, single episode, unspecified: Secondary | ICD-10-CM | POA: Diagnosis present

## 2013-09-28 DIAGNOSIS — I2510 Atherosclerotic heart disease of native coronary artery without angina pectoris: Secondary | ICD-10-CM | POA: Diagnosis present

## 2013-09-28 DIAGNOSIS — F411 Generalized anxiety disorder: Secondary | ICD-10-CM | POA: Diagnosis present

## 2013-09-28 DIAGNOSIS — M47812 Spondylosis without myelopathy or radiculopathy, cervical region: Principal | ICD-10-CM | POA: Diagnosis present

## 2013-09-28 DIAGNOSIS — Z7982 Long term (current) use of aspirin: Secondary | ICD-10-CM

## 2013-09-28 DIAGNOSIS — E538 Deficiency of other specified B group vitamins: Secondary | ICD-10-CM | POA: Diagnosis present

## 2013-09-28 DIAGNOSIS — E669 Obesity, unspecified: Secondary | ICD-10-CM | POA: Diagnosis present

## 2013-09-28 DIAGNOSIS — Z9089 Acquired absence of other organs: Secondary | ICD-10-CM

## 2013-09-28 DIAGNOSIS — G2581 Restless legs syndrome: Secondary | ICD-10-CM | POA: Diagnosis present

## 2013-09-28 DIAGNOSIS — Z981 Arthrodesis status: Secondary | ICD-10-CM

## 2013-09-28 DIAGNOSIS — J449 Chronic obstructive pulmonary disease, unspecified: Secondary | ICD-10-CM | POA: Diagnosis present

## 2013-09-28 DIAGNOSIS — M810 Age-related osteoporosis without current pathological fracture: Secondary | ICD-10-CM | POA: Diagnosis present

## 2013-09-28 DIAGNOSIS — F3289 Other specified depressive episodes: Secondary | ICD-10-CM | POA: Diagnosis present

## 2013-09-28 DIAGNOSIS — Z8249 Family history of ischemic heart disease and other diseases of the circulatory system: Secondary | ICD-10-CM

## 2013-09-28 DIAGNOSIS — J4489 Other specified chronic obstructive pulmonary disease: Secondary | ICD-10-CM | POA: Diagnosis present

## 2013-09-28 DIAGNOSIS — Z881 Allergy status to other antibiotic agents status: Secondary | ICD-10-CM

## 2013-09-28 DIAGNOSIS — M5020 Other cervical disc displacement, unspecified cervical region: Secondary | ICD-10-CM | POA: Diagnosis present

## 2013-09-28 DIAGNOSIS — G8929 Other chronic pain: Secondary | ICD-10-CM | POA: Diagnosis present

## 2013-09-28 DIAGNOSIS — Z9849 Cataract extraction status, unspecified eye: Secondary | ICD-10-CM

## 2013-09-28 DIAGNOSIS — I10 Essential (primary) hypertension: Secondary | ICD-10-CM | POA: Diagnosis present

## 2013-09-28 DIAGNOSIS — E119 Type 2 diabetes mellitus without complications: Secondary | ICD-10-CM | POA: Diagnosis present

## 2013-09-28 DIAGNOSIS — Z87891 Personal history of nicotine dependence: Secondary | ICD-10-CM

## 2013-09-28 DIAGNOSIS — K219 Gastro-esophageal reflux disease without esophagitis: Secondary | ICD-10-CM | POA: Diagnosis present

## 2013-09-28 DIAGNOSIS — Z91018 Allergy to other foods: Secondary | ICD-10-CM

## 2013-09-28 DIAGNOSIS — Z886 Allergy status to analgesic agent status: Secondary | ICD-10-CM

## 2013-09-28 DIAGNOSIS — M4722 Other spondylosis with radiculopathy, cervical region: Secondary | ICD-10-CM | POA: Diagnosis present

## 2013-09-28 DIAGNOSIS — Z882 Allergy status to sulfonamides status: Secondary | ICD-10-CM

## 2013-09-28 DIAGNOSIS — Z79899 Other long term (current) drug therapy: Secondary | ICD-10-CM

## 2013-09-28 DIAGNOSIS — I059 Rheumatic mitral valve disease, unspecified: Secondary | ICD-10-CM | POA: Diagnosis present

## 2013-09-28 DIAGNOSIS — G4700 Insomnia, unspecified: Secondary | ICD-10-CM | POA: Diagnosis present

## 2013-09-28 HISTORY — PX: ANTERIOR CERVICAL DECOMP/DISCECTOMY FUSION: SHX1161

## 2013-09-28 LAB — GLUCOSE, CAPILLARY
Glucose-Capillary: 137 mg/dL — ABNORMAL HIGH (ref 70–99)
Glucose-Capillary: 133 mg/dL — ABNORMAL HIGH (ref 70–99)

## 2013-09-28 SURGERY — ANTERIOR CERVICAL DECOMPRESSION/DISCECTOMY FUSION 2 LEVELS
Anesthesia: General

## 2013-09-28 MED ORDER — CYCLOBENZAPRINE HCL 10 MG PO TABS
10.0000 mg | ORAL_TABLET | Freq: Three times a day (TID) | ORAL | Status: DC | PRN
  Administered 2013-09-28 – 2013-09-29 (×2): 10 mg via ORAL
  Filled 2013-09-28 (×2): qty 1

## 2013-09-28 MED ORDER — ONDANSETRON HCL 4 MG/2ML IJ SOLN: 4.0000 mg | Freq: Once | INTRAMUSCULAR | Status: DC | PRN

## 2013-09-28 MED ORDER — TRAZODONE HCL 50 MG PO TABS
50.0000 mg | ORAL_TABLET | Freq: Every day | ORAL | Status: DC
  Administered 2013-09-28: 100 mg via ORAL
  Filled 2013-09-28 (×2): qty 2

## 2013-09-28 MED ORDER — DEXAMETHASONE 4 MG PO TABS
4.0000 mg | ORAL_TABLET | Freq: Four times a day (QID) | ORAL | Status: AC
  Administered 2013-09-28 (×2): 4 mg via ORAL
  Filled 2013-09-28 (×2): qty 1

## 2013-09-28 MED ORDER — ALBUTEROL SULFATE HFA 108 (90 BASE) MCG/ACT IN AERS
INHALATION_SPRAY | RESPIRATORY_TRACT | Status: DC | PRN
  Administered 2013-09-28: 2 via RESPIRATORY_TRACT

## 2013-09-28 MED ORDER — ACETAMINOPHEN 325 MG PO TABS: 650.0000 mg | ORAL_TABLET | ORAL | Status: DC | PRN

## 2013-09-28 MED ORDER — LACTATED RINGERS IV SOLN
INTRAVENOUS | Status: DC | PRN
  Administered 2013-09-28 (×2): via INTRAVENOUS

## 2013-09-28 MED ORDER — NEOSTIGMINE METHYLSULFATE 1 MG/ML IJ SOLN
INTRAMUSCULAR | Status: DC | PRN
  Administered 2013-09-28 (×2): 2 mg via INTRAVENOUS

## 2013-09-28 MED ORDER — SODIUM CHLORIDE 0.9 % IJ SOLN: 3.0000 mL | INTRAMUSCULAR | Status: DC | PRN

## 2013-09-28 MED ORDER — DEXAMETHASONE SODIUM PHOSPHATE 10 MG/ML IJ SOLN: 10.0000 mg | INTRAMUSCULAR | Status: DC

## 2013-09-28 MED ORDER — HYDROMORPHONE HCL PF 1 MG/ML IJ SOLN
INTRAMUSCULAR | Status: DC | PRN
  Administered 2013-09-28 (×2): 0.5 mg via INTRAVENOUS

## 2013-09-28 MED ORDER — ONDANSETRON HCL 4 MG/2ML IJ SOLN: 4.0000 mg | INTRAMUSCULAR | Status: DC | PRN

## 2013-09-28 MED ORDER — GLYCOPYRROLATE 0.2 MG/ML IJ SOLN
INTRAMUSCULAR | Status: DC | PRN
  Administered 2013-09-28: 0.4 mg via INTRAVENOUS
  Administered 2013-09-28: 0.2 mg via INTRAVENOUS

## 2013-09-28 MED ORDER — SIMVASTATIN 20 MG PO TABS
20.0000 mg | ORAL_TABLET | Freq: Every evening | ORAL | Status: DC
  Administered 2013-09-28: 20 mg via ORAL
  Filled 2013-09-28 (×2): qty 1

## 2013-09-28 MED ORDER — HYDROMORPHONE HCL PF 1 MG/ML IJ SOLN
1.0000 mg | INTRAMUSCULAR | Status: DC | PRN
  Administered 2013-09-28: 1.5 mg via INTRAMUSCULAR
  Filled 2013-09-28: qty 2

## 2013-09-28 MED ORDER — TIOTROPIUM BROMIDE MONOHYDRATE 18 MCG IN CAPS
18.0000 ug | ORAL_CAPSULE | Freq: Every day | RESPIRATORY_TRACT | Status: DC
  Administered 2013-09-29: 18 ug via RESPIRATORY_TRACT
  Filled 2013-09-28: qty 5

## 2013-09-28 MED ORDER — HEMOSTATIC AGENTS (NO CHARGE) OPTIME
TOPICAL | Status: DC | PRN
  Administered 2013-09-28: 1 via TOPICAL

## 2013-09-28 MED ORDER — HYDROMORPHONE HCL PF 1 MG/ML IJ SOLN
INTRAMUSCULAR | Status: AC
  Filled 2013-09-28: qty 1

## 2013-09-28 MED ORDER — FENTANYL CITRATE 0.05 MG/ML IJ SOLN
INTRAMUSCULAR | Status: DC | PRN
  Administered 2013-09-28: 100 ug via INTRAVENOUS
  Administered 2013-09-28: 50 ug via INTRAVENOUS
  Administered 2013-09-28 (×2): 100 ug via INTRAVENOUS
  Administered 2013-09-28: 150 ug via INTRAVENOUS

## 2013-09-28 MED ORDER — DEXAMETHASONE SODIUM PHOSPHATE 10 MG/ML IJ SOLN
INTRAMUSCULAR | Status: AC
  Administered 2013-09-28: 10 mg via INTRAVENOUS
  Filled 2013-09-28: qty 1

## 2013-09-28 MED ORDER — HYDROMORPHONE HCL PF 1 MG/ML IJ SOLN
0.2500 mg | INTRAMUSCULAR | Status: DC | PRN
  Administered 2013-09-28 (×3): 0.5 mg via INTRAVENOUS

## 2013-09-28 MED ORDER — SODIUM CHLORIDE 0.9 % IR SOLN
Status: DC | PRN
  Administered 2013-09-28: 09:00:00

## 2013-09-28 MED ORDER — 0.9 % SODIUM CHLORIDE (POUR BTL) OPTIME
TOPICAL | Status: DC | PRN
  Administered 2013-09-28: 1000 mL

## 2013-09-28 MED ORDER — SODIUM CHLORIDE 0.9 % IJ SOLN
3.0000 mL | Freq: Two times a day (BID) | INTRAMUSCULAR | Status: DC
  Administered 2013-09-28 (×2): 3 mL via INTRAVENOUS

## 2013-09-28 MED ORDER — MIDAZOLAM HCL 5 MG/5ML IJ SOLN
INTRAMUSCULAR | Status: DC | PRN
  Administered 2013-09-28 (×2): 1 mg via INTRAVENOUS

## 2013-09-28 MED ORDER — MONTELUKAST SODIUM 10 MG PO TABS
10.0000 mg | ORAL_TABLET | Freq: Every day | ORAL | Status: DC
  Administered 2013-09-28 – 2013-09-29 (×2): 10 mg via ORAL
  Filled 2013-09-28 (×2): qty 1

## 2013-09-28 MED ORDER — ONDANSETRON HCL 4 MG/2ML IJ SOLN
INTRAMUSCULAR | Status: DC | PRN
  Administered 2013-09-28: 4 mg via INTRAVENOUS

## 2013-09-28 MED ORDER — ALBUTEROL SULFATE (5 MG/ML) 0.5% IN NEBU: 2.5000 mg | INHALATION_SOLUTION | Freq: Four times a day (QID) | RESPIRATORY_TRACT | Status: DC | PRN

## 2013-09-28 MED ORDER — BUSPIRONE HCL 15 MG PO TABS
15.0000 mg | ORAL_TABLET | Freq: Three times a day (TID) | ORAL | Status: DC
  Administered 2013-09-28 – 2013-09-29 (×3): 15 mg via ORAL
  Filled 2013-09-28 (×5): qty 1

## 2013-09-28 MED ORDER — MENTHOL 3 MG MT LOZG
1.0000 | LOZENGE | OROMUCOSAL | Status: DC | PRN
  Filled 2013-09-28: qty 9

## 2013-09-28 MED ORDER — ALPRAZOLAM 0.5 MG PO TABS: 1.0000 mg | ORAL_TABLET | Freq: Four times a day (QID) | ORAL | Status: DC | PRN

## 2013-09-28 MED ORDER — FLUTICASONE PROPIONATE 50 MCG/ACT NA SUSP
1.0000 | Freq: Two times a day (BID) | NASAL | Status: DC
  Administered 2013-09-28: 1 via NASAL
  Filled 2013-09-28: qty 16

## 2013-09-28 MED ORDER — OXYCODONE-ACETAMINOPHEN 5-325 MG PO TABS
1.0000 | ORAL_TABLET | ORAL | Status: DC | PRN
  Administered 2013-09-28 – 2013-09-29 (×4): 2 via ORAL
  Filled 2013-09-28 (×4): qty 2

## 2013-09-28 MED ORDER — DEXAMETHASONE SODIUM PHOSPHATE 4 MG/ML IJ SOLN: 4.0000 mg | Freq: Four times a day (QID) | INTRAMUSCULAR | Status: AC

## 2013-09-28 MED ORDER — NITROGLYCERIN 0.4 MG SL SUBL: 0.4000 mg | SUBLINGUAL_TABLET | SUBLINGUAL | Status: DC | PRN

## 2013-09-28 MED ORDER — LIDOCAINE HCL (CARDIAC) 20 MG/ML IV SOLN
INTRAVENOUS | Status: DC | PRN
  Administered 2013-09-28: 80 mg via INTRAVENOUS
  Administered 2013-09-28: 10 mg via INTRAVENOUS
  Administered 2013-09-28: 20 mg via INTRAVENOUS

## 2013-09-28 MED ORDER — ARTIFICIAL TEARS OP OINT
TOPICAL_OINTMENT | OPHTHALMIC | Status: DC | PRN
  Administered 2013-09-28: 1 via OPHTHALMIC

## 2013-09-28 MED ORDER — PHENOL 1.4 % MT LIQD: 1.0000 | OROMUCOSAL | Status: DC | PRN

## 2013-09-28 MED ORDER — THROMBIN 5000 UNITS EX SOLR
CUTANEOUS | Status: DC | PRN
  Administered 2013-09-28: 5000 [IU] via TOPICAL

## 2013-09-28 MED ORDER — PANTOPRAZOLE SODIUM 40 MG IV SOLR
40.0000 mg | Freq: Every day | INTRAVENOUS | Status: DC
  Administered 2013-09-28: 40 mg via INTRAVENOUS
  Filled 2013-09-28 (×3): qty 40

## 2013-09-28 MED ORDER — ALBUTEROL SULFATE HFA 108 (90 BASE) MCG/ACT IN AERS
2.0000 | INHALATION_SPRAY | Freq: Four times a day (QID) | RESPIRATORY_TRACT | Status: DC | PRN
  Administered 2013-09-28: 2 via RESPIRATORY_TRACT

## 2013-09-28 MED ORDER — ACETAMINOPHEN 650 MG RE SUPP: 650.0000 mg | RECTAL | Status: DC | PRN

## 2013-09-28 MED ORDER — ROCURONIUM BROMIDE 100 MG/10ML IV SOLN
INTRAVENOUS | Status: DC | PRN
  Administered 2013-09-28: 10 mg via INTRAVENOUS
  Administered 2013-09-28: 50 mg via INTRAVENOUS

## 2013-09-28 MED ORDER — BUPIVACAINE HCL (PF) 0.5 % IJ SOLN
INTRAMUSCULAR | Status: DC | PRN
  Administered 2013-09-28: 10 mL

## 2013-09-28 MED ORDER — EPHEDRINE SULFATE 50 MG/ML IJ SOLN
INTRAMUSCULAR | Status: DC | PRN
  Administered 2013-09-28: 5 mg via INTRAVENOUS

## 2013-09-28 MED ORDER — CEFAZOLIN SODIUM-DEXTROSE 2-3 GM-% IV SOLR
2.0000 g | Freq: Three times a day (TID) | INTRAVENOUS | Status: AC
  Administered 2013-09-28 (×2): 2 g via INTRAVENOUS
  Filled 2013-09-28 (×2): qty 50

## 2013-09-28 MED ORDER — BUDESONIDE-FORMOTEROL FUMARATE 160-4.5 MCG/ACT IN AERO
2.0000 | INHALATION_SPRAY | Freq: Two times a day (BID) | RESPIRATORY_TRACT | Status: DC
  Filled 2013-09-28 (×2): qty 6

## 2013-09-28 MED ORDER — PROPOFOL 10 MG/ML IV BOLUS
INTRAVENOUS | Status: DC | PRN
  Administered 2013-09-28: 200 mg via INTRAVENOUS

## 2013-09-28 MED ORDER — KCL IN DEXTROSE-NACL 20-5-0.45 MEQ/L-%-% IV SOLN
80.0000 mL/h | INTRAVENOUS | Status: DC
  Filled 2013-09-28 (×4): qty 1000

## 2013-09-28 SURGICAL SUPPLY — 66 items
APL SKNCLS STERI-STRIP NONHPOA (GAUZE/BANDAGES/DRESSINGS) ×1
BAG DECANTER FOR FLEXI CONT (MISCELLANEOUS) ×2 IMPLANT
BENZOIN TINCTURE PRP APPL 2/3 (GAUZE/BANDAGES/DRESSINGS) ×4 IMPLANT
BIT DRILL TRINICA 2.3MM (BIT) IMPLANT
BNDG ADH 5X3 H2O RPLNT NS (GAUZE/BANDAGES/DRESSINGS) ×1
BNDG COHESIVE 3X5 WHT NS (GAUZE/BANDAGES/DRESSINGS) ×1 IMPLANT
BRUSH SCRUB EZ PLAIN DRY (MISCELLANEOUS) ×2 IMPLANT
BUR MATCHSTICK NEURO 3.0 LAGG (BURR) ×2 IMPLANT
CANISTER SUCT 3000ML (MISCELLANEOUS) ×2 IMPLANT
CONT SPEC 4OZ CLIKSEAL STRL BL (MISCELLANEOUS) ×2 IMPLANT
DRAIN SNY WOU 7FLT (WOUND CARE) ×1 IMPLANT
DRAPE C-ARM 42X72 X-RAY (DRAPES) ×4 IMPLANT
DRAPE LAPAROTOMY 100X72 PEDS (DRAPES) ×2 IMPLANT
DRAPE MICROSCOPE ZEISS OPMI (DRAPES) ×2 IMPLANT
DRAPE SURG 17X23 STRL (DRAPES) ×4 IMPLANT
DRESSING TELFA 8X3 (GAUZE/BANDAGES/DRESSINGS) ×2 IMPLANT
DRILL BIT TRINICA 2.3MM (BIT) ×2
DURAPREP 6ML APPLICATOR 50/CS (WOUND CARE) ×2 IMPLANT
ELECT COATED BLADE 2.86 ST (ELECTRODE) ×2 IMPLANT
ELECT REM PT RETURN 9FT ADLT (ELECTROSURGICAL) ×2
ELECTRODE REM PT RTRN 9FT ADLT (ELECTROSURGICAL) ×1 IMPLANT
EVACUATOR SILICONE 100CC (DRAIN) ×1 IMPLANT
GAUZE SPONGE 4X4 16PLY XRAY LF (GAUZE/BANDAGES/DRESSINGS) IMPLANT
GLOVE BIOGEL PI IND STRL 7.5 (GLOVE) IMPLANT
GLOVE BIOGEL PI IND STRL 8 (GLOVE) IMPLANT
GLOVE BIOGEL PI INDICATOR 7.5 (GLOVE) ×1
GLOVE BIOGEL PI INDICATOR 8 (GLOVE) ×1
GLOVE ECLIPSE 7.0 STRL STRAW (GLOVE) ×1 IMPLANT
GLOVE ECLIPSE 7.5 STRL STRAW (GLOVE) ×3 IMPLANT
GLOVE ECLIPSE 8.0 STRL XLNG CF (GLOVE) ×2 IMPLANT
GLOVE EXAM NITRILE LRG STRL (GLOVE) IMPLANT
GLOVE EXAM NITRILE MD LF STRL (GLOVE) ×2 IMPLANT
GLOVE EXAM NITRILE XL STR (GLOVE) IMPLANT
GLOVE EXAM NITRILE XS STR PU (GLOVE) IMPLANT
GOWN BRE IMP SLV AUR LG STRL (GOWN DISPOSABLE) ×2 IMPLANT
GOWN BRE IMP SLV AUR XL STRL (GOWN DISPOSABLE) IMPLANT
GOWN STRL REIN 2XL LVL4 (GOWN DISPOSABLE) ×2 IMPLANT
HEAD HALTER (SOFTGOODS) ×2 IMPLANT
INTERBODY TM 11X14X5-7DEG ANG (Metal Cage) ×1 IMPLANT
INTERBODY TM 11X14X7-7DEG ANG (Metal Cage) ×1 IMPLANT
KIT BASIN OR (CUSTOM PROCEDURE TRAY) ×2 IMPLANT
KIT ROOM TURNOVER OR (KITS) ×2 IMPLANT
NDL SPNL 20GX3.5 QUINCKE YW (NEEDLE) ×1 IMPLANT
NEEDLE SPNL 20GX3.5 QUINCKE YW (NEEDLE) ×2 IMPLANT
NS IRRIG 1000ML POUR BTL (IV SOLUTION) ×2 IMPLANT
PACK LAMINECTOMY NEURO (CUSTOM PROCEDURE TRAY) ×2 IMPLANT
PAD ARMBOARD 7.5X6 YLW CONV (MISCELLANEOUS) ×2 IMPLANT
PATTIES SURGICAL .75X.75 (GAUZE/BANDAGES/DRESSINGS) ×2 IMPLANT
PLATE 22MM (Plate) ×1 IMPLANT
PLATE 28MM (Plate) ×1 IMPLANT
PUTTY BONE GRAFT KIT 2.5ML (Bone Implant) ×1 IMPLANT
RUBBERBAND STERILE (MISCELLANEOUS) ×4 IMPLANT
SCREW RESCUE FIXED 12MM (Screw) ×4 IMPLANT
SCREW SELF DRILL VAR 12MM (Screw) ×4 IMPLANT
SPONGE GAUZE 4X4 12PLY (GAUZE/BANDAGES/DRESSINGS) ×2 IMPLANT
SPONGE INTESTINAL PEANUT (DISPOSABLE) ×2 IMPLANT
SPONGE SURGIFOAM ABS GEL SZ50 (HEMOSTASIS) ×2 IMPLANT
STAPLER VISISTAT 35W (STAPLE) ×1 IMPLANT
STRIP CLOSURE SKIN 1/2X4 (GAUZE/BANDAGES/DRESSINGS) ×2 IMPLANT
SUT PDS AB 5-0 P3 18 (SUTURE) ×2 IMPLANT
SUT VIC AB 3-0 CP2 18 (SUTURE) ×3 IMPLANT
SYR 20ML ECCENTRIC (SYRINGE) IMPLANT
TOWEL OR 17X24 6PK STRL BLUE (TOWEL DISPOSABLE) ×2 IMPLANT
TOWEL OR 17X26 10 PK STRL BLUE (TOWEL DISPOSABLE) ×2 IMPLANT
TRAP SPECIMEN MUCOUS 40CC (MISCELLANEOUS) IMPLANT
WATER STERILE IRR 1000ML POUR (IV SOLUTION) ×2 IMPLANT

## 2013-09-28 NOTE — Plan of Care (Signed)
Problem: Consults Goal: Diagnosis - Spinal Surgery Outcome: Completed/Met Date Met:  09/28/13 Cervical Spine Fusion

## 2013-09-28 NOTE — H&P (Signed)
Shannon Morse is an 57 y.o. female.   Chief Complaint: Neck pain into the arms morselized the left and the right HPI: The patient is a [age] female who was evaluated for neck pain with radiation in the arms. The pins were similar on the left muscle was further down the arm. She's had the problems since 2009. She had neck surgery by Dr. low at that time has had persistent difficulty. Dr. Purvis Sheffield has moved away and she has been seen a physician assistant since that time. She's also going to pain management. She's had some studies 6 months ago now comes for evaluation. After evaluation the office her films were reviewed which showed an old fusion at C5-6 and symmetric and stenosis at C4-5 and C6-7. After discussing the options the patient requested surgery now comes for a two-level anterior cervical discectomy with fusion and plating. I've had a long discussion with her regarding the risks and benefits of surgical intervention. The risks discussed include but are not limited to bleeding infection weakness was paralysis spinal fluid leak coma quadriplegia hoarseness and death. We've discussed alternative methods of therapy offered risks and benefits of nonintervention. She's had the opportunity to ask numerous questions and appears to understand. With this information in hand she has requested we proceed with surgery.  Past Medical History  Diagnosis Date  . Anemia   . GERD (gastroesophageal reflux disease)   . COPD (chronic obstructive pulmonary disease)   . Barrett's esophagus   . Obesity   . Allergic rhinitis   . Gastritis   . Vitamin B12 deficiency   . Tobacco abuse   . HSV (herpes simplex virus) infection   . Mitral regurgitation     mild to moderate by echo 06/2013  . DDD (degenerative disc disease)   . Osteoporosis   . Chronic pain   . DJD (degenerative joint disease)   . Restless leg syndrome   . Anxiety   . Depression   . Insomnia   . Pneumonia   . Hypertension     hx of; PCP took  pt off of BP meds  . Shortness of breath     with exertion  . Constipation due to pain medication   . Bruises easily   . Seasonal allergies   . Sleep apnea     does not wear machine d/t fiancial costs  . On home oxygen therapy     3 Liters nightly & PRN  . Coronary artery disease     normal coronaries by 03/2013 cath  . Anginal pain     with normal coronaries by cath 03/2013    Past Surgical History  Procedure Laterality Date  . Tonsillectomy    . Back surgery      C5-C6 anterior discectomy with fusion  . Carpal tunnel release    . Cystoscopy vaginoscopy w/ vaginal dilation    . Eye surgery      cataract extraction-bilateral  . Esophagogastroduodenoscopy endoscopy    . Tee without cardioversion N/A 07/19/2013    Procedure: TRANSESOPHAGEAL ECHOCARDIOGRAM (TEE);  Surgeon: Thurmon Fair, MD;  Location: [facility] ENDOSCOPY;  Service: Cardiovascular;  Laterality: N/A;  . Cardiac catheterization  2014    2009  . Abdominal hysterectomy    . Colonoscopy    . Cholecystectomy    . Ankle surgery Left     Family History  Problem Relation Age of Onset  . Coronary artery disease Mother 22    MI  . Coronary artery disease  Father     MI  . Coronary artery disease Sister 76    MI  . Heart failure Father    Social History:  reports that she has quit smoking. Her smoking use included Cigarettes. She smoked 0.00 packs per day for 40 years. She quit smokeless tobacco use about 18 months ago. She reports that she drinks alcohol. She reports that she does not use illicit drugs.  Allergies:  Allergies  Allergen Reactions  . Aspirin Other (See Comments)    Can not take high doses  . Avelox [Moxifloxacin] Hives  . Okra Other (See Comments)    Childhood broke out in welps  (doesn't eat)  . Strawberry Other (See Comments)    Childhood broke out in welps (doesn't eat)  . Sulfa Antibiotics Nausea And Vomiting  . Avelox [Moxifloxacin Hcl In Nacl] Rash    Medications Prior to Admission   Medication Sig Dispense Refill  . albuterol (PROVENTIL HFA;VENTOLIN HFA) 108 (90 BASE) MCG/ACT inhaler Inhale 2 puffs into the lungs every 6 (six) hours as needed for wheezing or shortness of breath.      Marland Kitchen albuterol (PROVENTIL) (2.5 MG/3ML) 0.083% nebulizer solution Take 2.5 mg by nebulization every 6 (six) hours as needed for wheezing or shortness of breath.      . ALPRAZolam (XANAX) 1 MG tablet Take 1 mg by mouth 4 (four) times daily as needed for anxiety.      Marland Kitchen aspirin 81 MG tablet Take 81 mg by mouth every other day.       . budesonide-formoterol (SYMBICORT) 160-4.5 MCG/ACT inhaler Inhale 2 puffs into the lungs 2 (two) times daily.      . busPIRone (BUSPAR) 15 MG tablet Take 15 mg by mouth 3 (three) times daily.      . Cholecalciferol (VITAMIN D PO) Take 1 tablet by mouth daily.      . Cyanocobalamin (VITAMIN B-12 PO) Take 1 tablet by mouth daily.      Marland Kitchen dexlansoprazole (DEXILANT) 60 MG capsule Take 60 mg by mouth 2 (two) times daily.      . diclofenac sodium (VOLTAREN) 1 % GEL Apply 2 g topically daily as needed (pain).      . fluticasone (FLONASE) 50 MCG/ACT nasal spray Place 1 spray into both nostrils 2 (two) times daily.      . montelukast (SINGULAIR) 10 MG tablet Take 10 mg by mouth every morning.      . nitroGLYCERIN (NITROSTAT) 0.4 MG SL tablet Place 0.4 mg under the tongue every 5 (five) minutes as needed for chest pain.      Marland Kitchen oxyCODONE (ROXICODONE) 15 MG immediate release tablet Take 15 mg by mouth every 6 (six) hours as needed for pain.      . OxyCODONE HCl ER (OXYCONTIN) 60 MG T12A Take 60 mg by mouth 3 (three) times daily.      . pramipexole (MIRAPEX) 0.75 MG tablet Take 0.75 mg by mouth at bedtime.      . simvastatin (ZOCOR) 20 MG tablet Take 20 mg by mouth every evening.      . tiotropium (SPIRIVA) 18 MCG inhalation capsule Place 18 mcg into inhaler and inhale daily.      . traZODone (DESYREL) 50 MG tablet Take 50-100 mg by mouth at bedtime.        No results found for  this or any previous visit (from the past 48 hour(s)). No results found.  Positive for ringing in the ears nasal congestion chronic cough and  occasional shortness of breath  Blood pressure 152/90, pulse 65, temperature 97.8 F (36.6 C), temperature source Oral, resp. rate 20, SpO2 100.00%.  The patient is awake or and oriented. She is normal mental status. Number function is intact. Strength is 5 over 5 and sensation is intact to light touch. She is 2+ upper extremity reflexes 1+ lower extremity reflexes Assessment/Plan Impression is that of spondylosis and stenosis at C4-5 and C6-7 after an anterior cervical discectomy C5-6. The plan is for a C4-5 and C6-7 anterior cervical discectomy with fusion and plating.  Reinaldo Meeker, MD 09/28/2013, 7:31 AM

## 2013-09-28 NOTE — Addendum Note (Signed)
Addendum created 09/28/13 1154 by Rosalio Macadamia, CRNA   Modules edited: Anesthesia Flowsheet

## 2013-09-28 NOTE — Preoperative (Signed)
Beta Blockers   Reason not to administer Beta Blockers:Not Applicable 

## 2013-09-28 NOTE — Transfer of Care (Signed)
Immediate Anesthesia Transfer of Care Note  Patient: Shannon Morse  Procedure(s) Performed: Procedure(s): CERVICAL FOUR-FIVE, CERVICAL SIX-SEVEN ANTERIOR CERVICAL DECOMPRESSION/DISCECTOMY FUSION 2 LEVELS (N/A)  Patient Location: PACU  Anesthesia Type:General  Level of Consciousness: sedated and responds to stimulation  Airway & Oxygen Therapy: Patient Spontanous Breathing and Patient connected to face mask oxygen  Post-op Assessment: Report given to PACU RN and Post -op Vital signs reviewed and stable  Post vital signs: Reviewed and stable  Complications: No apparent anesthesia complications

## 2013-09-28 NOTE — Op Note (Signed)
Preop diagnosis: Spondylosis and herniated disc C4-5 C6-7 Postop diagnosis: Same Procedure: C4-5 C6-7 decompressive anterior cervical discectomy with trabecular metal interbody fusion and Trinica anterior cervical plating Surgeon: Keshonna Valvo Assistant: Conchita Paris  After being placed in the supine position in 10 pounds halter traction the patient's neck was prepped and draped in usual sterile fashion. Localizing fluoroscopy was used prior to incision to identify the appropriate level. Transverse incision was made in the right anterior neck started midline and headed towards the medial aspect of the sternal cremaster muscle. The platysma muscle was incised transversely. The natural fascial plane between the strap muscles medially and the sternal cremaster laterally was identified and followed down to the anterior aspect the cervical spine. Longus Cole muscles were identified and split in the midline to play bilaterally with unipolar coagulation and Kitner dissection. The previous anterior cervical plate at Z6-1 was identified and removed without difficulty. Bone wax was placed in the previous screw holes to stop any bleeding. We placed a retractor C4-5 incised the disc with a 15 blade. Using pituitary rongeurs and curettes approximately 90% of the disc material was removed. High-speed drill was used to widen the interspace and bony shavings were saved for use later in the case. At this time the microscope was draped brought in the field and used for the remainder of the case. Using microdissection technique the remainder of the disc material down the posterior longitudinal ligament was removed. Ligament was incised transversely and the cut edges removed a Kerrison punch. Thorough decompression was carried out on the spinal dura into the foramen bilaterally particularly on the left were systematic side to the C5 nerve roots well visualized well decompressed. At this time irrigation was carried out and any bleeding  control proper coagulation Gelfoam. Measurements were taken and a 5 mm lordotic trabecular metal graft was chosen and filled a mixture of autologous bone morselized allograft. Was impacted of difficulty and fossae showed to be in good position. An appropriately length Trinica anterior cervical plate was then chosen. Under fluoroscopic guidance drill holes were placed followed by placing of 12 mm rescue screws in C5 and 12 mm variable-angle screws at C4. The locking mechanism and rotated locked position and fossae showed good position of the plates screws and plugs. Irrigation was carried out attention was then turned to C6-7. At this level and this was incised a 15 blade the disc thoroughly cleaned out pituitary rongeurs and curettes. Once again high-speed was used to widen the interspace. Once again using microdissection technique the remainder of the disc material down the posterior longitudinal ligament was removed. Ligament was incised transversely and the cut edges removed a Kerrison punch. Was then press was carried out on the spinal dura into the foramen bilaterally until the C7 nerve roots well visualized well decompressed. Irrigation was carried out and any bleeding control proper coagulation Gelfoam. Measurements were taken and a 7 mm lordotic graft was chosen. It was filled with a mixture of autologous bone morselized allograft and impacted the disc space without difficulty. Fossae showed good position of the plug. An appropriately length Trinica anterior cervical plate was then chosen. Once again risky screws were placed in the holes and C6 and a new screws were placed at C7 we placed 12 mm variable-angle screws. Locking mechanism was rotated locked position final fossae showed good position of the plates screws and plugs. Irrigation was carried out and any bleeding control proper coagulation Gelfoam. A prevertebral drain was left in the prevertebral space and brought  out through separate stab incision.  The was then closed with inverted Vicryl on the platysma muscle subcuticular layer. Steri-Strips were placed on the skin. Shortness was then applied the patient was extubated and taken to recovery in stable condition.

## 2013-09-28 NOTE — Anesthesia Postprocedure Evaluation (Signed)
  Anesthesia Post-op Note  Patient: Shannon Morse  Procedure(s) Performed: Procedure(s): CERVICAL FOUR-FIVE, CERVICAL SIX-SEVEN ANTERIOR CERVICAL DECOMPRESSION/DISCECTOMY FUSION 2 LEVELS (N/A)  Patient Location: PACU  Anesthesia Type:General  Level of Consciousness: awake, oriented, sedated and patient cooperative  Airway and Oxygen Therapy: Patient Spontanous Breathing  Post-op Pain: mild  Post-op Assessment: Post-op Vital signs reviewed, Patient's Cardiovascular Status Stable, Respiratory Function Stable, Patent Airway, No signs of Nausea or vomiting and Pain level controlled  Post-op Vital Signs: stable  Complications: No apparent anesthesia complications

## 2013-09-28 NOTE — Progress Notes (Signed)
PFT's have not been received as of 0600 today.

## 2013-09-28 NOTE — Anesthesia Preprocedure Evaluation (Addendum)
Anesthesia Evaluation  Patient identified by MRN, date of birth, ID band Patient awake    Reviewed: Allergy & Precautions, H&P , NPO status , Patient's Chart, lab work & pertinent test results  Airway Mallampati: II TM Distance: >3 FB Neck ROM: Limited    Dental  (+) Dental Advisory Given and Partial Upper   Pulmonary shortness of breath and Long-Term Oxygen Therapy, sleep apnea , COPD COPD inhaler and oxygen dependent, former smoker,    + decreased breath sounds      Cardiovascular hypertension, Pt. on medications + angina + CAD Rhythm:Regular Rate:Normal     Neuro/Psych Anxiety    GI/Hepatic GERD-  Medicated,  Endo/Other  diabetes  Renal/GU      Musculoskeletal   Abdominal (+) + obese,   Peds  Hematology   Anesthesia Other Findings   Reproductive/Obstetrics                          Anesthesia Physical Anesthesia Plan  ASA: III  Anesthesia Plan: General   Post-op Pain Management:    Induction: Intravenous  Airway Management Planned: Oral ETT  Additional Equipment:   Intra-op Plan:   Post-operative Plan: Extubation in OR  Informed Consent: I have reviewed the patients History and Physical, chart, labs and discussed the procedure including the risks, benefits and alternatives for the proposed anesthesia with the patient or authorized representative who has indicated his/her understanding and acceptance.     Plan Discussed with:   Anesthesia Plan Comments:         Anesthesia Quick Evaluation

## 2013-09-29 LAB — GLUCOSE, CAPILLARY: Glucose-Capillary: 87 mg/dL (ref 70–99)

## 2013-09-29 MED ORDER — POLYETHYLENE GLYCOL 3350 17 G PO PACK
17.0000 g | PACK | Freq: Every day | ORAL | Status: DC | PRN
  Administered 2013-09-29: 17 g via ORAL
  Filled 2013-09-29: qty 1

## 2013-09-29 NOTE — Progress Notes (Signed)
Pt. Alert and oriented,follows simple instructions, denies pain. Incision area without swelling, redness or S/S of infection. Voiding adequate clear yellow urine. Moving all extremities well and vitals stable and documented. Patient discharged home with family. Anterior cervical surgery notes instructions given to patient and family member for home safety and precautions. Pt. and family stated understanding of instructions given. 

## 2013-09-29 NOTE — Discharge Summary (Signed)
Physician Discharge Summary  Patient ID: Shannon Morse MRN: [id number] DOB/AGE: 56-May-1957 56 y.o.  Admit date: 09/28/2013 Discharge date: 09/29/2013  Admission Diagnoses:  Discharge Diagnoses:  Active Problems:   Cervical spondylosis with radiculopathy   Discharged Condition: good  Hospital Course: Had 2 level acdf Tuesday for spondylosis. Did well post op with marked improvement in pain. Wound fine. Up ambulating pod 1, and ready for d/c. Specific instructions given.  Consults: None  Significant Diagnostic Studies: none  Treatments: surgery: C 45 C 67 acdf  Discharge Exam: Blood pressure 131/69, pulse 67, temperature 97.7 F (36.5 C), temperature source Oral, resp. rate 20, SpO2 94.00%. wound clean and dry; no new neuro issues  Disposition: 01-Home or Self Care     Medication List    ASK your doctor about these medications       albuterol (2.5 MG/3ML) 0.083% nebulizer solution  Commonly known as:  PROVENTIL  Take 2.5 mg by nebulization every 6 (six) hours as needed for wheezing or shortness of breath.     albuterol 108 (90 BASE) MCG/ACT inhaler  Commonly known as:  PROVENTIL HFA;VENTOLIN HFA  Inhale 2 puffs into the lungs every 6 (six) hours as needed for wheezing or shortness of breath.     ALPRAZolam 1 MG tablet  Commonly known as:  XANAX  Take 1 mg by mouth 4 (four) times daily as needed for anxiety.     aspirin 81 MG tablet  Take 81 mg by mouth every other day.     budesonide-formoterol 160-4.5 MCG/ACT inhaler  Commonly known as:  SYMBICORT  Inhale 2 puffs into the lungs 2 (two) times daily.     busPIRone 15 MG tablet  Commonly known as:  BUSPAR  Take 15 mg by mouth 3 (three) times daily.     DEXILANT 60 MG capsule  Generic drug:  dexlansoprazole  Take 60 mg by mouth 2 (two) times daily.     diclofenac sodium 1 % Gel  Commonly known as:  VOLTAREN  Apply 2 g topically daily as needed (pain).     fluticasone 50 MCG/ACT nasal spray  Commonly  known as:  FLONASE  Place 1 spray into both nostrils 2 (two) times daily.     montelukast 10 MG tablet  Commonly known as:  SINGULAIR  Take 10 mg by mouth every morning.     nitroGLYCERIN 0.4 MG SL tablet  Commonly known as:  NITROSTAT  Place 0.4 mg under the tongue every 5 (five) minutes as needed for chest pain.     OXYCONTIN 60 MG T12a  Generic drug:  OxyCODONE HCl ER  Take 60 mg by mouth 3 (three) times daily.     oxyCODONE 15 MG immediate release tablet  Commonly known as:  ROXICODONE  Take 15 mg by mouth every 6 (six) hours as needed for pain.     pramipexole 0.75 MG tablet  Commonly known as:  MIRAPEX  Take 0.75 mg by mouth at bedtime.     simvastatin 20 MG tablet  Commonly known as:  ZOCOR  Take 20 mg by mouth every evening.     tiotropium 18 MCG inhalation capsule  Commonly known as:  SPIRIVA  Place 18 mcg into inhaler and inhale daily.     traZODone 50 MG tablet  Commonly known as:  DESYREL  Take 50-100 mg by mouth at bedtime.     VITAMIN B-12 PO  Take 1 tablet by mouth daily.     VITAMIN D PO  Take 1 tablet by mouth daily.         At home rest most of the time. Get up 9 or 10 times each day and take a 15 or 20 minute walk. No riding in the car and to your first postoperative appointment. If you have neck surgery you may shower from the chest down starting on the third postoperative day. If you had back surgery he may start showering on the third postoperative day with saran wrap wrapped around your incisional area 3 times. After the shower remove the saran wrap. Take pain medicine as needed and other medications as instructed. Call my office for an appointment.  SignedReinaldo Meeker, MD 09/29/2013, 9:08 AM

## 2013-09-30 ENCOUNTER — Encounter (HOSPITAL_COMMUNITY): Payer: Self-pay | Admitting: Neurosurgery

## 2014-08-24 ENCOUNTER — Other Ambulatory Visit (HOSPITAL_COMMUNITY): Payer: Self-pay | Admitting: Student

## 2014-08-24 ENCOUNTER — Other Ambulatory Visit (HOSPITAL_COMMUNITY): Payer: Self-pay | Admitting: Neurosurgery

## 2014-08-24 DIAGNOSIS — D496 Neoplasm of unspecified behavior of brain: Secondary | ICD-10-CM

## 2014-08-25 ENCOUNTER — Other Ambulatory Visit: Payer: Self-pay | Admitting: Neurosurgery

## 2014-09-02 ENCOUNTER — Encounter (HOSPITAL_COMMUNITY)
Admission: RE | Admit: 2014-09-02 | Discharge: 2014-09-02 | Disposition: A | Discharge status: Home / Self Care | Payer: Medicare Other | Source: Ambulatory Visit | Attending: Neurosurgery | Admitting: Neurosurgery

## 2014-09-02 ENCOUNTER — Ambulatory Visit (HOSPITAL_COMMUNITY)
Admission: RE | Admit: 2014-09-02 | Discharge: 2014-09-02 | Disposition: A | Discharge status: Home / Self Care | Payer: Medicare Other | Source: Ambulatory Visit | Attending: Neurosurgery | Admitting: Neurosurgery

## 2014-09-02 ENCOUNTER — Encounter (HOSPITAL_COMMUNITY): Payer: Self-pay

## 2014-09-02 DIAGNOSIS — Z01818 Encounter for other preprocedural examination: Secondary | ICD-10-CM | POA: Diagnosis present

## 2014-09-02 DIAGNOSIS — D496 Neoplasm of unspecified behavior of brain: Secondary | ICD-10-CM

## 2014-09-02 DIAGNOSIS — D320 Benign neoplasm of cerebral meninges: Secondary | ICD-10-CM | POA: Diagnosis not present

## 2014-09-02 DIAGNOSIS — Z01812 Encounter for preprocedural laboratory examination: Secondary | ICD-10-CM | POA: Diagnosis not present

## 2014-09-02 DIAGNOSIS — R51 Headache: Secondary | ICD-10-CM | POA: Diagnosis present

## 2014-09-02 HISTORY — DX: Myoneural disorder, unspecified: G70.9

## 2014-09-02 HISTORY — DX: Unspecified asthma, uncomplicated: J45.909

## 2014-09-02 LAB — BASIC METABOLIC PANEL
ANION GAP: 10 (ref 5–15)
BUN: 13 mg/dL (ref 6–23)
CO2: 28 meq/L (ref 19–32)
CREATININE: 0.7 mg/dL (ref 0.50–1.10)
Calcium: 9.3 mg/dL (ref 8.4–10.5)
Chloride: 100 mEq/L (ref 96–112)
GFR calc Af Amer: >90 mL/min (ref >90)
GFR calc non Af Amer: >90 mL/min (ref >90)
Glucose, Bld: 104 mg/dL — ABNORMAL HIGH (ref 70–99)
Potassium: 3.9 mEq/L (ref 3.7–5.3)
Sodium: 138 mEq/L (ref 137–147)

## 2014-09-02 LAB — CBC
HEMATOCRIT: 35.6 % — AB (ref 36.0–46.0)
HEMOGLOBIN: 12.1 g/dL (ref 12.0–15.0)
MCH: 29.7 pg (ref 26.0–34.0)
MCHC: 34 g/dL (ref 30.0–36.0)
MCV: 87.5 fL (ref 78.0–100.0)
Platelets: 252 10*3/uL (ref 150–400)
RBC: 4.07 MIL/uL (ref 3.87–5.11)
RDW: 13.4 % (ref 11.5–15.5)
WBC: 5.2 10*3/uL (ref 4.0–10.5)

## 2014-09-02 LAB — TYPE AND SCREEN
ABO/RH(D): A POS
ANTIBODY SCREEN: NEGATIVE

## 2014-09-02 LAB — ABO/RH: ABO/RH(D): A POS

## 2014-09-02 MED ORDER — GADOBENATE DIMEGLUMINE 529 MG/ML IV SOLN
15.0000 mL | Freq: Once | INTRAVENOUS | Status: AC
  Administered 2014-09-02: 15 mL via INTRAVENOUS

## 2014-09-02 NOTE — Pre-Procedure Instructions (Addendum)
STARLEE CORRALEJO  09/02/2014   Your procedure is scheduled on:  Nov 19th at 1000  Report to [place] at 800AM.  Call this number if you have problems the morning of surgery: [phone]   Remember:   Do not eat food or drink liquids after midnight.   Take these medicines the morning of surgery with A SIP OF WATER: albuterol (Proventil) inhaler, xanax if needed, symbicort inhaler, Buspirone (Busbar), dexlansoprazole( dexilant), Flonase nasal spray, montelukast (singular), Oxycodone( roxicodone) if needed, Oxycontin, Spiriva.   Bring your inhalers with you on the day of surgery.  Stop taking Aspirin, Ibuprofen, BC's, Goody's, Herbal medications, Fish Oil   Do not wear jewelry, make-up or nail polish.  Do not wear lotions, powders, or perfumes. You may wear deodorant.  Do not shave 48 hours prior to surgery. Men may shave face and neck.  Do not bring valuables to the hospital.  [facility] is not responsible                  for any belongings or valuables.               Contacts, dentures or bridgework may not be worn into surgery.  Leave suitcase in the car. After surgery it may be brought to your room.  For patients admitted to the hospital, discharge time is determined by your                treatment team.               Patients discharged the day of surgery will not be allowed to drive  home.    Special Instructions: [facility] - Preparing for Surgery  Before surgery, you can play an important role.  Because skin is not sterile, your skin needs to be as free of germs as possible.  You can reduce the number of germs on you skin by washing with CHG (chlorahexidine gluconate) soap before surgery.  CHG is an antiseptic cleaner which kills germs and bonds with the skin to continue killing germs even after washing.  Please DO NOT use if you have an allergy to CHG or antibacterial soaps.  If your skin becomes reddened/irritated stop using the CHG and inform your  nurse when you arrive at Short Stay.  Do not shave (including legs and underarms) for at least 48 hours prior to the first CHG shower.  You may shave your face.  Please follow these instructions carefully:   1.  Shower with CHG Soap the night before surgery and the                                morning of Surgery.  2.  If you choose to wash your hair, wash your hair first as usual with your       normal shampoo.  3.  After you shampoo, rinse your hair and body thoroughly to remove the                      Shampoo.  4.  Use CHG as you would any other liquid soap.  You can apply chg directly       to the skin and wash gently with scrungie or a clean washcloth.  5.  Apply the CHG Soap to your body ONLY FROM THE NECK DOWN.        Do not  use on open wounds or open sores.  Avoid contact with your eyes,       ears, mouth and genitals (private parts).  Wash genitals (private parts)       with your normal soap.  6.  Wash thoroughly, paying special attention to the area where your surgery        will be performed.  7.  Thoroughly rinse your body with warm water from the neck down.  8.  DO NOT shower/wash with your normal soap after using and rinsing off       the CHG Soap.  9.  Pat yourself dry with a clean towel.            10.  Wear clean pajamas.            11.  Place clean sheets on your bed the night of your first shower and do not        sleep with pets.  Day of Surgery  Do not apply any lotions/deoderants the morning of surgery.  Please wear clean clothes to the hospital/surgery center.      Please read over the following fact sheets that you were given: Pain Booklet, Coughing and Deep Breathing, Blood Transfusion Information, MRSA Information and Surgical Site Infection Prevention

## 2014-09-02 NOTE — Progress Notes (Addendum)
Anesthesia Chart Review:   Pt is 58 year old female scheduled for R frontal craniotomy with curve on 09/08/2014 with Dr. Hal Neer.   History includes former smoker (quit ~ 2013, recently restarted and then quit again), COPD, night time home oxygen, OSA without CPAP use, chest pain with history of normal coronaries by cath on 03/29/13, mild to moderate mitral regurgitation by 06/2013 echo, obesity, chronic DOE, GERD, Barrett's esophagus, gastritis, anemia, RLS, HTN, anxiety, osteoporosis. S/p C4-5, C6-7 ACDF on 09/28/13. Pulmonologist is Dr. Verdie Mosher. Primary cardiologist is Dr. Lawson Radar at [facility]. He referred patient to Dr. Quay Burow for cardiac cath 2014 (see below).  Cardiac cath on 03/29/13 showed normal coronaries, estimated LVEF 60% without wall motion abnormalities.   Echo on 07/19/13 showed: - Left ventricle: The cavity size was normal. Wall thickness was normal. Systolic function was normal. The estimated ejection fraction was in the range of 55% to 60%. Wall motion was normal; there were no regional wall motion abnormalities. - Aortic valve: No evidence of vegetation. Trivial regurgitation. - Mitral valve: Mild to moderate regurgitation directed centrally. - Left atrium: No evidence of thrombus in the atrial cavity or appendage. No spontaneous echo contrast was observed. - Right atrium: No evidence of thrombus in the atrial cavity or appendage. - Atrial septum: No defect or patent foramen ovale was identified. Echo contrast study showed no right-to-left atrial level shunt, following an increase in RA pressure induced by provocative maneuvers. - Tricuspid valve: No evidence of vegetation. - Pulmonic valve: No evidence of vegetation.  EKG from [facility] nearly illegible but appears to be dated 2013. Will get EKG DOS.    CXR on 09/24/13 showed:  1. Lung hyperexpansion and bronchitic change without acute cardiopulmonary disease.  2. Left  basilar/retrocardiac heterogeneous opacities are favored to represent a combination of atelectasis/scar and a prominent left sided epicardial fat pad. No discrete focal airspace opacities.   According to 03/2013 notes by Dr. Claudie Leach, "PFTs and pulmonary evaluation [for dyspnea] have been unremarkable."   Preoperative labs noted.  As of 03/2013 pulmonary evaluation was "unremarkable." Cardiac cath was WNL 2014 . MR is only mild to moderate.  Awaiting updated cardiology and pulmonary notes from [place], FNP-BC [facility] Stay [facility]/Anesthesiology Phone: [phone] 09/02/2014 4:58 PM   Addendum:   Received most recent office visit notes from pulmonology and cardiology.   Last office visit with cardiology  Dr. Claudie Leach 08/08/2014.  2D Echo performed. Study conclusions: 1. 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 2. Mild to moderate mitral regurgitation is present. 3. No evidence of pulmonary hypertension. 4. Normal cardiac chamber sizes and function, no pericardial effusion or intracardiac mass. Normal thoracic aorta and aortic arch.   Last office visit with pulmonology Dr. Verdie Mosher 08/04/2014. Seen for COPD exacerbation, dyspnea on exertion. Given dexamethasone and encouraged to use inhalers. Lungs clear at this visit.    If pt's COPD remains stable and EKG stable, I anticipate pt can proceed with surgery as scheduled.   Willeen Cass, FNP-BC [facility] Short Stay Surgical Center/Anesthesiology Phone: [phone] 09/05/2014 4:42 PM

## 2014-09-02 NOTE — Progress Notes (Signed)
Primary - dr. Mady Haagensen - pulmonary Cardiologist - dr. Earlyne Iba  All with [facility] medical in [place] - will request records for echo performed recently and ekg and last office note

## 2014-09-02 NOTE — Progress Notes (Signed)
Pt not here for her pre-admit visit. Called at [phone] and [phone], with no answer at either number.  Message left to call us back.

## 2014-09-07 MED ORDER — CEFAZOLIN SODIUM-DEXTROSE 2-3 GM-% IV SOLR
2.0000 g | INTRAVENOUS | Status: AC
  Administered 2014-09-08: 2 g via INTRAVENOUS
  Filled 2014-09-07: qty 50

## 2014-09-07 MED ORDER — DEXAMETHASONE SODIUM PHOSPHATE 10 MG/ML IJ SOLN
10.0000 mg | INTRAMUSCULAR | Status: AC
  Administered 2014-09-08: 10 mg via INTRAVENOUS
  Filled 2014-09-07: qty 1

## 2014-09-08 ENCOUNTER — Inpatient Hospital Stay (HOSPITAL_COMMUNITY): Payer: Medicare Other | Admitting: Emergency Medicine

## 2014-09-08 ENCOUNTER — Encounter (HOSPITAL_COMMUNITY)
Admission: RE | Disposition: A | Discharge status: Home / Self Care | Payer: Self-pay | Source: Ambulatory Visit | Attending: Neurosurgery

## 2014-09-08 ENCOUNTER — Inpatient Hospital Stay (HOSPITAL_COMMUNITY): Payer: Medicare Other | Admitting: Certified Registered Nurse Anesthetist

## 2014-09-08 ENCOUNTER — Encounter (HOSPITAL_COMMUNITY): Payer: Self-pay | Admitting: Certified Registered Nurse Anesthetist

## 2014-09-08 ENCOUNTER — Inpatient Hospital Stay (HOSPITAL_COMMUNITY)
Admission: RE | Admit: 2014-09-08 | Discharge: 2014-09-10 | DRG: 026 | Disposition: A | Discharge status: Home / Self Care | Payer: Medicare Other | Source: Ambulatory Visit | Attending: Neurosurgery | Admitting: Neurosurgery

## 2014-09-08 DIAGNOSIS — F329 Major depressive disorder, single episode, unspecified: Secondary | ICD-10-CM | POA: Diagnosis present

## 2014-09-08 DIAGNOSIS — Z9842 Cataract extraction status, left eye: Secondary | ICD-10-CM

## 2014-09-08 DIAGNOSIS — I129 Hypertensive chronic kidney disease with stage 1 through stage 4 chronic kidney disease, or unspecified chronic kidney disease: Secondary | ICD-10-CM | POA: Diagnosis present

## 2014-09-08 DIAGNOSIS — Z9841 Cataract extraction status, right eye: Secondary | ICD-10-CM

## 2014-09-08 DIAGNOSIS — D320 Benign neoplasm of cerebral meninges: Secondary | ICD-10-CM | POA: Diagnosis present

## 2014-09-08 DIAGNOSIS — M810 Age-related osteoporosis without current pathological fracture: Secondary | ICD-10-CM | POA: Diagnosis present

## 2014-09-08 DIAGNOSIS — Z981 Arthrodesis status: Secondary | ICD-10-CM | POA: Diagnosis not present

## 2014-09-08 DIAGNOSIS — J45909 Unspecified asthma, uncomplicated: Secondary | ICD-10-CM | POA: Diagnosis present

## 2014-09-08 DIAGNOSIS — N184 Chronic kidney disease, stage 4 (severe): Secondary | ICD-10-CM | POA: Diagnosis present

## 2014-09-08 DIAGNOSIS — I2510 Atherosclerotic heart disease of native coronary artery without angina pectoris: Secondary | ICD-10-CM | POA: Diagnosis present

## 2014-09-08 DIAGNOSIS — G8929 Other chronic pain: Secondary | ICD-10-CM | POA: Diagnosis present

## 2014-09-08 DIAGNOSIS — Z79899 Other long term (current) drug therapy: Secondary | ICD-10-CM | POA: Diagnosis not present

## 2014-09-08 DIAGNOSIS — K219 Gastro-esophageal reflux disease without esophagitis: Secondary | ICD-10-CM | POA: Diagnosis present

## 2014-09-08 DIAGNOSIS — G4730 Sleep apnea, unspecified: Secondary | ICD-10-CM | POA: Diagnosis present

## 2014-09-08 DIAGNOSIS — Z87891 Personal history of nicotine dependence: Secondary | ICD-10-CM | POA: Diagnosis not present

## 2014-09-08 DIAGNOSIS — I340 Nonrheumatic mitral (valve) insufficiency: Secondary | ICD-10-CM | POA: Diagnosis present

## 2014-09-08 DIAGNOSIS — E669 Obesity, unspecified: Secondary | ICD-10-CM | POA: Diagnosis present

## 2014-09-08 DIAGNOSIS — Z6830 Body mass index (BMI) 30.0-30.9, adult: Secondary | ICD-10-CM | POA: Diagnosis not present

## 2014-09-08 DIAGNOSIS — K2270 Barrett's esophagus without dysplasia: Secondary | ICD-10-CM | POA: Diagnosis present

## 2014-09-08 DIAGNOSIS — R410 Disorientation, unspecified: Secondary | ICD-10-CM | POA: Diagnosis present

## 2014-09-08 DIAGNOSIS — F419 Anxiety disorder, unspecified: Secondary | ICD-10-CM | POA: Diagnosis present

## 2014-09-08 DIAGNOSIS — Z09 Encounter for follow-up examination after completed treatment for conditions other than malignant neoplasm: Secondary | ICD-10-CM

## 2014-09-08 HISTORY — PX: CRANIOTOMY: SHX93

## 2014-09-08 LAB — CBC
HCT: 35.6 % — ABNORMAL LOW (ref 36.0–46.0)
Hemoglobin: 11.6 g/dL — ABNORMAL LOW (ref 12.0–15.0)
MCH: 28.6 pg (ref 26.0–34.0)
MCHC: 32.6 g/dL (ref 30.0–36.0)
MCV: 87.9 fL (ref 78.0–100.0)
PLATELETS: 184 10*3/uL (ref 150–400)
RBC: 4.05 MIL/uL (ref 3.87–5.11)
RDW: 13.3 % (ref 11.5–15.5)
WBC: 7.3 10*3/uL (ref 4.0–10.5)

## 2014-09-08 LAB — POCT I-STAT 3, ART BLOOD GAS (G3+)
ACID-BASE EXCESS: 3 mmol/L — AB (ref 0.0–2.0)
BICARBONATE: 28.8 meq/L — AB (ref 20.0–24.0)
O2 Saturation: 94 %
PH ART: 7.384 (ref 7.350–7.450)
TCO2: 30 mmol/L (ref 0–100)
pCO2 arterial: 48.3 mmHg — ABNORMAL HIGH (ref 35.0–45.0)
pO2, Arterial: 71 mmHg — ABNORMAL LOW (ref 80.0–100.0)

## 2014-09-08 LAB — BASIC METABOLIC PANEL
Anion gap: 13 (ref 5–15)
BUN: 9 mg/dL (ref 6–23)
CALCIUM: 8.7 mg/dL (ref 8.4–10.5)
CO2: 27 mEq/L (ref 19–32)
Chloride: 101 mEq/L (ref 96–112)
Creatinine, Ser: 0.55 mg/dL (ref 0.50–1.10)
GFR calc Af Amer: >90 mL/min (ref >90)
Glucose, Bld: 175 mg/dL — ABNORMAL HIGH (ref 70–99)
POTASSIUM: 3.7 meq/L (ref 3.7–5.3)
Sodium: 141 mEq/L (ref 137–147)

## 2014-09-08 LAB — MRSA PCR SCREENING: MRSA by PCR: NEGATIVE

## 2014-09-08 SURGERY — CRANIOTOMY TUMOR EXCISION
Anesthesia: General | Site: Head | Laterality: Right

## 2014-09-08 MED ORDER — LIDOCAINE HCL 4 % MT SOLN
OROMUCOSAL | Status: DC | PRN
  Administered 2014-09-08: 3 mL via TOPICAL

## 2014-09-08 MED ORDER — NEOSTIGMINE METHYLSULFATE 10 MG/10ML IV SOLN
INTRAVENOUS | Status: DC | PRN
  Administered 2014-09-08: 4 mg via INTRAVENOUS

## 2014-09-08 MED ORDER — LEVETIRACETAM IN NACL 500 MG/100ML IV SOLN
500.0000 mg | Freq: Two times a day (BID) | INTRAVENOUS | Status: DC
  Administered 2014-09-08 – 2014-09-10 (×4): 500 mg via INTRAVENOUS
  Filled 2014-09-08 (×6): qty 100

## 2014-09-08 MED ORDER — VARENICLINE TARTRATE 1 MG PO TABS
1.0000 mg | ORAL_TABLET | Freq: Two times a day (BID) | ORAL | Status: DC
  Administered 2014-09-08 – 2014-09-10 (×5): 1 mg via ORAL
  Filled 2014-09-08 (×8): qty 1

## 2014-09-08 MED ORDER — GLYCOPYRROLATE 0.2 MG/ML IJ SOLN
INTRAMUSCULAR | Status: DC | PRN
  Administered 2014-09-08: 0.6 mg via INTRAVENOUS

## 2014-09-08 MED ORDER — BUSPIRONE HCL 15 MG PO TABS: 15.0000 mg | ORAL_TABLET | Freq: Three times a day (TID) | ORAL | Status: DC

## 2014-09-08 MED ORDER — BUSPIRONE HCL 10 MG PO TABS
15.0000 mg | ORAL_TABLET | ORAL | Status: DC
  Administered 2014-09-08 – 2014-09-09 (×4): 15 mg via ORAL
  Filled 2014-09-08 (×2): qty 2
  Filled 2014-09-08 (×2): qty 1
  Filled 2014-09-08: qty 2
  Filled 2014-09-08 (×2): qty 1

## 2014-09-08 MED ORDER — FENTANYL CITRATE 0.05 MG/ML IJ SOLN
INTRAMUSCULAR | Status: AC
  Filled 2014-09-08: qty 2

## 2014-09-08 MED ORDER — GABAPENTIN 400 MG PO CAPS
400.0000 mg | ORAL_CAPSULE | Freq: Every day | ORAL | Status: DC
  Administered 2014-09-08 – 2014-09-09 (×2): 400 mg via ORAL
  Filled 2014-09-08 (×3): qty 1

## 2014-09-08 MED ORDER — ESMOLOL HCL 10 MG/ML IV SOLN
INTRAVENOUS | Status: AC
  Filled 2014-09-08: qty 10

## 2014-09-08 MED ORDER — PROPOFOL 10 MG/ML IV BOLUS
INTRAVENOUS | Status: AC
  Filled 2014-09-08: qty 20

## 2014-09-08 MED ORDER — HYDRALAZINE HCL 20 MG/ML IJ SOLN
INTRAMUSCULAR | Status: DC | PRN
  Administered 2014-09-08: 2 mg via INTRAVENOUS

## 2014-09-08 MED ORDER — FENTANYL CITRATE 0.05 MG/ML IJ SOLN
INTRAMUSCULAR | Status: DC | PRN
  Administered 2014-09-08 (×5): 50 ug via INTRAVENOUS

## 2014-09-08 MED ORDER — FENTANYL CITRATE 0.05 MG/ML IJ SOLN
INTRAMUSCULAR | Status: AC
Start: 2014-09-08 — End: 2014-09-08
  Filled 2014-09-08: qty 5

## 2014-09-08 MED ORDER — LINACLOTIDE 145 MCG PO CAPS
145.0000 ug | ORAL_CAPSULE | Freq: Every day | ORAL | Status: DC
  Administered 2014-09-08 – 2014-09-10 (×3): 145 ug via ORAL
  Filled 2014-09-08 (×3): qty 1

## 2014-09-08 MED ORDER — PROPOFOL 10 MG/ML IV BOLUS
INTRAVENOUS | Status: AC
Start: 2014-09-08 — End: 2014-09-08
  Filled 2014-09-08: qty 20

## 2014-09-08 MED ORDER — ONDANSETRON HCL 4 MG/2ML IJ SOLN: 4.0000 mg | INTRAMUSCULAR | Status: DC | PRN

## 2014-09-08 MED ORDER — LIDOCAINE HCL (CARDIAC) 20 MG/ML IV SOLN
INTRAVENOUS | Status: DC | PRN
  Administered 2014-09-08: 70 mg via INTRAVENOUS

## 2014-09-08 MED ORDER — ONDANSETRON HCL 4 MG PO TABS: 4.0000 mg | ORAL_TABLET | ORAL | Status: DC | PRN

## 2014-09-08 MED ORDER — PHENYLEPHRINE 40 MCG/ML (10ML) SYRINGE FOR IV PUSH (FOR BLOOD PRESSURE SUPPORT)
PREFILLED_SYRINGE | INTRAVENOUS | Status: AC
  Filled 2014-09-08: qty 10

## 2014-09-08 MED ORDER — SODIUM CHLORIDE 0.9 % IJ SOLN
INTRAMUSCULAR | Status: AC
  Filled 2014-09-08: qty 10

## 2014-09-08 MED ORDER — SODIUM CHLORIDE 0.9 % IV SOLN
INTRAVENOUS | Status: DC | PRN
  Administered 2014-09-08: 10:00:00 via INTRAVENOUS

## 2014-09-08 MED ORDER — LIDOCAINE HCL (CARDIAC) 20 MG/ML IV SOLN
INTRAVENOUS | Status: AC
  Filled 2014-09-08: qty 5

## 2014-09-08 MED ORDER — SODIUM CHLORIDE 0.9 % IR SOLN
Status: DC | PRN
  Administered 2014-09-08: 500 mL

## 2014-09-08 MED ORDER — SURGIFOAM 100 EX MISC
CUTANEOUS | Status: DC | PRN
  Administered 2014-09-08: 20 mL via TOPICAL

## 2014-09-08 MED ORDER — MANNITOL 25 % IV SOLN
INTRAVENOUS | Status: DC | PRN
  Administered 2014-09-08: 50 g via INTRAVENOUS

## 2014-09-08 MED ORDER — DEXAMETHASONE SODIUM PHOSPHATE 4 MG/ML IJ SOLN
4.0000 mg | Freq: Three times a day (TID) | INTRAMUSCULAR | Status: DC
  Filled 2014-09-08: qty 1

## 2014-09-08 MED ORDER — LABETALOL HCL 5 MG/ML IV SOLN: 10.0000 mg | INTRAVENOUS | Status: DC | PRN

## 2014-09-08 MED ORDER — NEOSTIGMINE METHYLSULFATE 10 MG/10ML IV SOLN
INTRAVENOUS | Status: AC
  Filled 2014-09-08: qty 1

## 2014-09-08 MED ORDER — PROPOFOL 10 MG/ML IV BOLUS
INTRAVENOUS | Status: DC | PRN
  Administered 2014-09-08: 150 mg via INTRAVENOUS
  Administered 2014-09-08: 50 mg via INTRAVENOUS

## 2014-09-08 MED ORDER — GLYCOPYRROLATE 0.2 MG/ML IJ SOLN
INTRAMUSCULAR | Status: AC
  Filled 2014-09-08: qty 4

## 2014-09-08 MED ORDER — ROCURONIUM BROMIDE 100 MG/10ML IV SOLN
INTRAVENOUS | Status: DC | PRN
  Administered 2014-09-08: 50 mg via INTRAVENOUS

## 2014-09-08 MED ORDER — ESMOLOL HCL 10 MG/ML IV SOLN
INTRAVENOUS | Status: DC | PRN
  Administered 2014-09-08 (×3): 10 mg via INTRAVENOUS

## 2014-09-08 MED ORDER — HYDROCODONE-ACETAMINOPHEN 5-325 MG PO TABS
1.0000 | ORAL_TABLET | ORAL | Status: DC | PRN
  Administered 2014-09-09 – 2014-09-10 (×4): 1 via ORAL
  Filled 2014-09-08 (×5): qty 1

## 2014-09-08 MED ORDER — TRAZODONE HCL 100 MG PO TABS
100.0000 mg | ORAL_TABLET | Freq: Every day | ORAL | Status: DC
  Filled 2014-09-08: qty 1.5

## 2014-09-08 MED ORDER — MONTELUKAST SODIUM 10 MG PO TABS
10.0000 mg | ORAL_TABLET | ORAL | Status: DC
  Administered 2014-09-09 – 2014-09-10 (×2): 10 mg via ORAL
  Filled 2014-09-08 (×3): qty 1

## 2014-09-08 MED ORDER — ARTIFICIAL TEARS OP OINT
TOPICAL_OINTMENT | OPHTHALMIC | Status: AC
  Filled 2014-09-08: qty 3.5

## 2014-09-08 MED ORDER — DEXAMETHASONE SODIUM PHOSPHATE 4 MG/ML IJ SOLN
4.0000 mg | Freq: Four times a day (QID) | INTRAMUSCULAR | Status: DC
  Administered 2014-09-09 – 2014-09-10 (×3): 4 mg via INTRAVENOUS
  Filled 2014-09-08 (×6): qty 1

## 2014-09-08 MED ORDER — BUDESONIDE-FORMOTEROL FUMARATE 160-4.5 MCG/ACT IN AERO
2.0000 | INHALATION_SPRAY | Freq: Two times a day (BID) | RESPIRATORY_TRACT | Status: DC
  Administered 2014-09-08 – 2014-09-10 (×4): 2 via RESPIRATORY_TRACT
  Filled 2014-09-08: qty 6

## 2014-09-08 MED ORDER — DEXAMETHASONE SODIUM PHOSPHATE 10 MG/ML IJ SOLN
6.0000 mg | Freq: Four times a day (QID) | INTRAMUSCULAR | Status: AC
  Administered 2014-09-08 – 2014-09-09 (×4): 6 mg via INTRAVENOUS
  Filled 2014-09-08: qty 0.6
  Filled 2014-09-08 (×3): qty 1

## 2014-09-08 MED ORDER — NALOXONE HCL 0.4 MG/ML IJ SOLN: 0.0800 mg | INTRAMUSCULAR | Status: DC | PRN

## 2014-09-08 MED ORDER — VECURONIUM BROMIDE 10 MG IV SOLR
INTRAVENOUS | Status: AC
  Filled 2014-09-08: qty 10

## 2014-09-08 MED ORDER — TRAZODONE HCL 100 MG PO TABS
150.0000 mg | ORAL_TABLET | Freq: Every day | ORAL | Status: DC
  Administered 2014-09-08 – 2014-09-09 (×2): 150 mg via ORAL
  Filled 2014-09-08 (×4): qty 1

## 2014-09-08 MED ORDER — CEFAZOLIN SODIUM-DEXTROSE 2-3 GM-% IV SOLR
2.0000 g | Freq: Three times a day (TID) | INTRAVENOUS | Status: AC
  Administered 2014-09-08 – 2014-09-09 (×2): 2 g via INTRAVENOUS
  Filled 2014-09-08 (×2): qty 50

## 2014-09-08 MED ORDER — ALBUTEROL SULFATE (2.5 MG/3ML) 0.083% IN NEBU
2.5000 mg | INHALATION_SOLUTION | Freq: Four times a day (QID) | RESPIRATORY_TRACT | Status: DC | PRN
  Administered 2014-09-09: 2.5 mg via RESPIRATORY_TRACT
  Filled 2014-09-08 (×2): qty 3

## 2014-09-08 MED ORDER — ROCURONIUM BROMIDE 50 MG/5ML IV SOLN
INTRAVENOUS | Status: AC
  Filled 2014-09-08: qty 1

## 2014-09-08 MED ORDER — BUSPIRONE HCL 10 MG PO TABS
30.0000 mg | ORAL_TABLET | Freq: Every day | ORAL | Status: DC
  Administered 2014-09-09 – 2014-09-10 (×2): 30 mg via ORAL
  Filled 2014-09-08: qty 3
  Filled 2014-09-08: qty 2

## 2014-09-08 MED ORDER — SUCCINYLCHOLINE CHLORIDE 20 MG/ML IJ SOLN
INTRAMUSCULAR | Status: AC
  Filled 2014-09-08: qty 1

## 2014-09-08 MED ORDER — DOCUSATE SODIUM 100 MG PO CAPS
100.0000 mg | ORAL_CAPSULE | Freq: Two times a day (BID) | ORAL | Status: DC
  Administered 2014-09-08 – 2014-09-10 (×5): 100 mg via ORAL
  Filled 2014-09-08 (×6): qty 1

## 2014-09-08 MED ORDER — PROMETHAZINE HCL 25 MG PO TABS: 12.5000 mg | ORAL_TABLET | ORAL | Status: DC | PRN

## 2014-09-08 MED ORDER — KCL IN DEXTROSE-NACL 20-5-0.45 MEQ/L-%-% IV SOLN
INTRAVENOUS | Status: DC
  Administered 2014-09-08: 15:00:00 via INTRAVENOUS
  Filled 2014-09-08 (×2): qty 1000

## 2014-09-08 MED ORDER — NITROGLYCERIN 0.4 MG SL SUBL: 0.4000 mg | SUBLINGUAL_TABLET | SUBLINGUAL | Status: DC | PRN

## 2014-09-08 MED ORDER — FENTANYL CITRATE 0.05 MG/ML IJ SOLN
25.0000 ug | INTRAMUSCULAR | Status: DC | PRN
  Administered 2014-09-08 (×5): 25 ug via INTRAVENOUS

## 2014-09-08 MED ORDER — ONDANSETRON HCL 4 MG/2ML IJ SOLN
INTRAMUSCULAR | Status: DC | PRN
  Administered 2014-09-08: 4 mg via INTRAVENOUS

## 2014-09-08 MED ORDER — MIDAZOLAM HCL 2 MG/2ML IJ SOLN
INTRAMUSCULAR | Status: AC
  Filled 2014-09-08: qty 2

## 2014-09-08 MED ORDER — LACTATED RINGERS IV SOLN
INTRAVENOUS | Status: DC
  Administered 2014-09-08: 09:00:00 via INTRAVENOUS

## 2014-09-08 MED ORDER — PANTOPRAZOLE SODIUM 40 MG IV SOLR
40.0000 mg | Freq: Every day | INTRAVENOUS | Status: DC
  Administered 2014-09-08 – 2014-09-09 (×2): 40 mg via INTRAVENOUS
  Filled 2014-09-08 (×2): qty 40

## 2014-09-08 MED ORDER — HYDRALAZINE HCL 20 MG/ML IJ SOLN
INTRAMUSCULAR | Status: AC
  Filled 2014-09-08: qty 1

## 2014-09-08 MED ORDER — SODIUM CHLORIDE 0.9 % IR SOLN
Status: DC | PRN
  Administered 2014-09-08 (×2): 1000 mL

## 2014-09-08 MED ORDER — HYDROMORPHONE HCL 1 MG/ML IJ SOLN
0.5000 mg | INTRAMUSCULAR | Status: DC | PRN
  Administered 2014-09-08 – 2014-09-10 (×4): 1 mg via INTRAVENOUS
  Filled 2014-09-08 (×5): qty 1

## 2014-09-08 MED ORDER — VECURONIUM BROMIDE 10 MG IV SOLR
INTRAVENOUS | Status: DC | PRN
  Administered 2014-09-08: 1 mg via INTRAVENOUS

## 2014-09-08 MED ORDER — FENTANYL CITRATE 0.05 MG/ML IJ SOLN
INTRAMUSCULAR | Status: AC
  Filled 2014-09-08: qty 5

## 2014-09-08 MED ORDER — ARTIFICIAL TEARS OP OINT
TOPICAL_OINTMENT | OPHTHALMIC | Status: DC | PRN
Start: 2014-09-08 — End: 2014-09-08
  Administered 2014-09-08: 1 via OPHTHALMIC

## 2014-09-08 MED ORDER — TIOTROPIUM BROMIDE MONOHYDRATE 18 MCG IN CAPS
18.0000 ug | ORAL_CAPSULE | Freq: Every day | RESPIRATORY_TRACT | Status: DC
  Administered 2014-09-09 – 2014-09-10 (×3): 18 ug via RESPIRATORY_TRACT
  Filled 2014-09-08: qty 5

## 2014-09-08 MED ORDER — ONDANSETRON HCL 4 MG/2ML IJ SOLN
INTRAMUSCULAR | Status: AC
  Filled 2014-09-08: qty 2

## 2014-09-08 MED ORDER — MICROFIBRILLAR COLL HEMOSTAT EX PADS
MEDICATED_PAD | CUTANEOUS | Status: DC | PRN
  Administered 2014-09-08: 1 via TOPICAL

## 2014-09-08 MED ORDER — LIDOCAINE HCL (PF) 1 % IJ SOLN
INTRAMUSCULAR | Status: DC | PRN
  Administered 2014-09-08: 7 mL

## 2014-09-08 SURGICAL SUPPLY — 78 items
BAG DECANTER FOR FLEXI CONT (MISCELLANEOUS) ×3 IMPLANT
BANDAGE GAUZE 4  KLING STR (GAUZE/BANDAGES/DRESSINGS) ×6 IMPLANT
BLADE CLIPPER SURG (BLADE) ×3 IMPLANT
BNDG GAUZE ELAST 4 BULKY (GAUZE/BANDAGES/DRESSINGS) ×3 IMPLANT
BRUSH SCRUB EZ 1% IODOPHOR (MISCELLANEOUS) ×3 IMPLANT
BRUSH SCRUB EZ PLAIN DRY (MISCELLANEOUS) ×3 IMPLANT
BUR ROUTER D-58 CRANI (BURR) IMPLANT
CANISTER SUCT 3000ML (MISCELLANEOUS) ×6 IMPLANT
CLIP TI MEDIUM 6 (CLIP) ×3 IMPLANT
CONT SPEC 4OZ CLIKSEAL STRL BL (MISCELLANEOUS) ×6 IMPLANT
DRAIN SNY WOU 7FLT (WOUND CARE) IMPLANT
DRAPE MICROSCOPE LEICA (MISCELLANEOUS) ×3 IMPLANT
DRAPE NEUROLOGICAL W/INCISE (DRAPES) ×3 IMPLANT
DRAPE SURG 17X23 STRL (DRAPES) ×6 IMPLANT
DRAPE WARM FLUID 44X44 (DRAPE) ×3 IMPLANT
DRSG TELFA 3X8 NADH (GAUZE/BANDAGES/DRESSINGS) ×3 IMPLANT
ELECT CAUTERY BLADE 6.4 (BLADE) ×3 IMPLANT
ELECT REM PT RETURN 9FT ADLT (ELECTROSURGICAL) ×3
ELECTRODE REM PT RTRN 9FT ADLT (ELECTROSURGICAL) ×1 IMPLANT
EVACUATOR 1/8 PVC DRAIN (DRAIN) IMPLANT
EVACUATOR SILICONE 100CC (DRAIN) IMPLANT
GAUZE SPONGE 4X4 12PLY STRL (GAUZE/BANDAGES/DRESSINGS) ×3 IMPLANT
GAUZE SPONGE 4X4 16PLY XRAY LF (GAUZE/BANDAGES/DRESSINGS) IMPLANT
GLOVE BIO SURGEON STRL SZ8 (GLOVE) ×2 IMPLANT
GLOVE BIOGEL PI IND STRL 7.0 (GLOVE) IMPLANT
GLOVE BIOGEL PI IND STRL 7.5 (GLOVE) IMPLANT
GLOVE BIOGEL PI IND STRL 8.5 (GLOVE) IMPLANT
GLOVE BIOGEL PI INDICATOR 7.0 (GLOVE) ×4
GLOVE BIOGEL PI INDICATOR 7.5 (GLOVE) ×2
GLOVE BIOGEL PI INDICATOR 8.5 (GLOVE) ×2
GLOVE ECLIPSE 7.5 STRL STRAW (GLOVE) ×4 IMPLANT
GLOVE ECLIPSE 8.0 STRL XLNG CF (GLOVE) ×5 IMPLANT
GLOVE EXAM NITRILE LRG STRL (GLOVE) IMPLANT
GLOVE EXAM NITRILE XL STR (GLOVE) IMPLANT
GLOVE EXAM NITRILE XS STR PU (GLOVE) IMPLANT
GOWN STRL REUS W/ TWL LRG LVL3 (GOWN DISPOSABLE) IMPLANT
GOWN STRL REUS W/ TWL XL LVL3 (GOWN DISPOSABLE) IMPLANT
GOWN STRL REUS W/TWL 2XL LVL3 (GOWN DISPOSABLE) ×1 IMPLANT
GOWN STRL REUS W/TWL LRG LVL3 (GOWN DISPOSABLE) ×6
GOWN STRL REUS W/TWL XL LVL3 (GOWN DISPOSABLE) ×6
HEMOSTAT SURGICEL 2X14 (HEMOSTASIS) ×3 IMPLANT
KIT BASIN OR (CUSTOM PROCEDURE TRAY) ×3 IMPLANT
KIT ROOM TURNOVER OR (KITS) ×3 IMPLANT
NEEDLE HYPO 22GX1.5 SAFETY (NEEDLE) ×3 IMPLANT
NS IRRIG 1000ML POUR BTL (IV SOLUTION) ×6 IMPLANT
PACK CRANIOTOMY (CUSTOM PROCEDURE TRAY) ×3 IMPLANT
PAD ARMBOARD 7.5X6 YLW CONV (MISCELLANEOUS) ×3 IMPLANT
PAD DRESSING TELFA 3X8 NADH (GAUZE/BANDAGES/DRESSINGS) ×1 IMPLANT
PATTIES SURGICAL .25X.25 (GAUZE/BANDAGES/DRESSINGS) IMPLANT
PATTIES SURGICAL .5 X.5 (GAUZE/BANDAGES/DRESSINGS) IMPLANT
PATTIES SURGICAL .5 X3 (DISPOSABLE) IMPLANT
PATTIES SURGICAL .75X.75 (GAUZE/BANDAGES/DRESSINGS) ×3 IMPLANT
PATTIES SURGICAL 1X1 (DISPOSABLE) IMPLANT
PERFORATOR LRG  14-11MM (BIT) ×2
PERFORATOR LRG 14-11MM (BIT) ×1 IMPLANT
PLATE 1.5  2HOLE LNG NEURO (Plate) ×2 IMPLANT
PLATE 1.5 2H 17 DOU T (Plate) ×6 IMPLANT
PLATE 1.5 2HOLE LNG NEURO (Plate) IMPLANT
RUBBERBAND STERILE (MISCELLANEOUS) ×6 IMPLANT
SCREW SELF DRILL HT 1.5/4MM (Screw) ×20 IMPLANT
SPECIMEN JAR SMALL (MISCELLANEOUS) IMPLANT
SPONGE LAP 18X18 X RAY DECT (DISPOSABLE) IMPLANT
SPONGE NEURO XRAY DETECT 1X3 (DISPOSABLE) IMPLANT
SPONGE SURGIFOAM ABS GEL 100 (HEMOSTASIS) ×3 IMPLANT
STAPLER SKIN PROX WIDE 3.9 (STAPLE) ×3 IMPLANT
SUT ETHILON 3 0 FSL (SUTURE) IMPLANT
SUT NURALON 4 0 TR CR/8 (SUTURE) ×9 IMPLANT
SUT VIC AB 2-0 OS6 18 (SUTURE) ×15 IMPLANT
SUT VIC AB 3-0 CP2 18 (SUTURE) IMPLANT
SWABSTICK BENZOIN STERILE (MISCELLANEOUS) ×3 IMPLANT
SYR CONTROL 10ML LL (SYRINGE) ×3 IMPLANT
TOWEL OR 17X24 6PK STRL BLUE (TOWEL DISPOSABLE) ×3 IMPLANT
TOWEL OR 17X26 10 PK STRL BLUE (TOWEL DISPOSABLE) ×3 IMPLANT
TRAY FOLEY CATH 14FRSI W/METER (CATHETERS) ×3 IMPLANT
TUBE CONNECTING 12'X1/4 (SUCTIONS) ×1
TUBE CONNECTING 12X1/4 (SUCTIONS) ×2 IMPLANT
UNDERPAD 30X30 INCONTINENT (UNDERPADS AND DIAPERS) ×3 IMPLANT
WATER STERILE IRR 1000ML POUR (IV SOLUTION) ×3 IMPLANT

## 2014-09-08 NOTE — Anesthesia Preprocedure Evaluation (Addendum)
Anesthesia Evaluation  Patient identified by MRN, date of birth, ID band Patient awake    Reviewed: Allergy & Precautions, H&P , NPO status , Patient's Chart, lab work & pertinent test results  Airway Mallampati: II  TM Distance: >3 FB Neck ROM: Full    Dental   Pulmonary shortness of breath, asthma , sleep apnea , pneumonia -, COPDformer smoker,    + decreased breath sounds      Cardiovascular hypertension, + angina + CAD Rhythm:Regular Rate:Normal     Neuro/Psych  Headaches, Anxiety Depression  Neuromuscular disease    GI/Hepatic GERD-  ,  Endo/Other  diabetes  Renal/GU Renal disease     Musculoskeletal   Abdominal   Peds  Hematology   Anesthesia Other Findings   Reproductive/Obstetrics                           Anesthesia Physical Anesthesia Plan  ASA: III  Anesthesia Plan: General   Post-op Pain Management:    Induction: Intravenous  Airway Management Planned: Oral ETT  Additional Equipment: Arterial line  Intra-op Plan:   Post-operative Plan: Possible Post-op intubation/ventilation  Informed Consent: I have reviewed the patients History and Physical, chart, labs and discussed the procedure including the risks, benefits and alternatives for the proposed anesthesia with the patient or authorized representative who has indicated his/her understanding and acceptance.     Plan Discussed with: Anesthesiologist, CRNA and Surgeon  Anesthesia Plan Comments:        Anesthesia Quick Evaluation

## 2014-09-08 NOTE — Progress Notes (Signed)
UR completed.  Alaila Pillard, RN BSN [facility] CCM Trauma/Neuro ICU Case Manager [phone]  

## 2014-09-08 NOTE — Anesthesia Procedure Notes (Signed)
Procedure Name: Intubation Date/Time: 09/08/2014 10:25 AM Performed by: Susa Loffler Pre-anesthesia Checklist: Patient identified, Timeout performed, Emergency Drugs available, Suction available and Patient being monitored Patient Re-evaluated:Patient Re-evaluated prior to inductionOxygen Delivery Method: Circle system utilized Preoxygenation: Pre-oxygenation with 100% oxygen Intubation Type: IV induction Ventilation: Mask ventilation without difficulty and Oral airway inserted - appropriate to patient size Laryngoscope Size: Mac and 3 Grade View: Grade I Tube type: Oral Tube size: 7.0 mm Number of attempts: 1 Airway Equipment and Method: Stylet and Oral airway Placement Confirmation: ETT inserted through vocal cords under direct vision,  positive ETCO2 and breath sounds checked- equal and bilateral Secured at: 21 cm Tube secured with: Tape Dental Injury: Teeth and Oropharynx as per pre-operative assessment

## 2014-09-08 NOTE — Anesthesia Postprocedure Evaluation (Signed)
  Anesthesia Post-op Note  Patient: Shannon Morse  Procedure(s) Performed: Procedure(s) with comments: Right Frontal Craniotomy w/Curve (Right) - Right Frontal Craniotomy w/Curve  Patient Location: PACU  Anesthesia Type:General  Level of Consciousness: awake  Airway and Oxygen Therapy: Patient Spontanous Breathing  Post-op Pain: mild  Post-op Assessment: Post-op Vital signs reviewed  Post-op Vital Signs: Reviewed  Last Vitals:  Filed Vitals:   09/08/14 0809  BP: 161/73  Pulse: 82  Temp: 36.9 C  Resp: 18    Complications: No apparent anesthesia complications

## 2014-09-08 NOTE — H&P (Signed)
Shannon Morse is an 58 y.o. female.   Chief Complaint: Right frontal mass HPI: The patient is a 58 year old female who is been a patient of mine for cervical issues in the past. She is done well with her cervical surgeries. She recently had an episode of disorientation and underwent an MRI scan of the brain. The showed a right frontal mass coming off of the falx with some indentation into the right frontal lobe. It was diffusely enhancing most consistent with a meningioma. We discussed the options the patient requested be removed and she now comes for a right frontal craniotomy for removal of her tumor. I had a long discussion with her regarding the risks and benefits of surgical intervention. The risks discussed include but are not limited to bleeding infection weakness numbness paralysis seizures coma and death. We have discussed alternative methods of therapy offered risks and benefits of nonintervention. She's had the opportunity to ask numerous questions and appears to understand. With this information in hand she has requested we proceed with surgery.  Past Medical History  Diagnosis Date  . Anemia   . GERD (gastroesophageal reflux disease)   . COPD (chronic obstructive pulmonary disease)   . Barrett's esophagus   . Obesity   . Allergic rhinitis   . Gastritis   . Vitamin B12 deficiency   . Tobacco abuse   . HSV (herpes simplex virus) infection   . Mitral regurgitation     mild to moderate by echo 06/2013  . DDD (degenerative disc disease)   . Osteoporosis   . Chronic pain   . DJD (degenerative joint disease)   . Restless leg syndrome   . Anxiety   . Depression   . Insomnia   . Hypertension     hx of; PCP took pt off of BP meds  . Constipation due to pain medication   . Bruises easily   . Seasonal allergies   . Sleep apnea     does not wear machine d/t fiancial costs  . On home oxygen therapy     3 Liters nightly & PRN  . Coronary artery disease     normal coronaries by  03/2013 cath  . Anginal pain     with normal coronaries by cath 03/2013  . Shortness of breath     with exertion; cannot lay flat  . Asthma   . Pneumonia     hx  . Chronic kidney disease     stage 4 kidney disease? does not see kidney doc  . Headache   . Neuromuscular disorder     Past Surgical History  Procedure Laterality Date  . Tonsillectomy    . Back surgery      C5-C6 anterior discectomy with fusion  . Carpal tunnel release    . Cystoscopy vaginoscopy w/ vaginal dilation    . Eye surgery      cataract extraction-bilateral  . Esophagogastroduodenoscopy endoscopy    . Tee without cardioversion N/A 07/19/2013    Procedure: TRANSESOPHAGEAL ECHOCARDIOGRAM (TEE);  Surgeon: Sanda Klein, MD;  Location: [facility];  Service: Cardiovascular;  Laterality: N/A;  . Cardiac catheterization  2014    2009  . Abdominal hysterectomy    . Colonoscopy    . Cholecystectomy    . Ankle surgery Left   . Anterior cervical decomp/discectomy fusion N/A 09/28/2013    Procedure: CERVICAL FOUR-FIVE, CERVICAL SIX-SEVEN ANTERIOR CERVICAL DECOMPRESSION/DISCECTOMY FUSION 2 LEVELS;  Surgeon: Faythe Ghee, MD;  Location: [facility];  Service: Neurosurgery;  Laterality: N/A;    Family History  Problem Relation Age of Onset  . Coronary artery disease Mother 28    MI  . Coronary artery disease Father     MI  . Coronary artery disease Sister 58    MI  . Heart failure Father    Social History:  reports that she has quit smoking. Her smoking use included Cigarettes. She smoked 0.00 packs per day for 40 years. She quit smokeless tobacco use about 2 years ago. She reports that she drinks alcohol. She reports that she does not use illicit drugs.  Allergies:  Allergies  Allergen Reactions  . Aspirin Other (See Comments)    Can not take high doses  . Avelox [Moxifloxacin] Hives  . Okra Other (See Comments)    Childhood broke out in welps  (doesn't eat)  . Strawberry Other (See Comments)     Childhood broke out in welps (doesn't eat)  . Sulfa Antibiotics Nausea And Vomiting  . Avelox [Moxifloxacin Hcl In Nacl] Rash    Medications Prior to Admission  Medication Sig Dispense Refill  . albuterol (PROVENTIL HFA;VENTOLIN HFA) 108 (90 BASE) MCG/ACT inhaler Inhale 2 puffs into the lungs every 6 (six) hours as needed for wheezing or shortness of breath.    Marland Kitchen albuterol (PROVENTIL) (2.5 MG/3ML) 0.083% nebulizer solution Take 2.5 mg by nebulization every 6 (six) hours as needed for wheezing or shortness of breath.    . ALPRAZolam (XANAX) 0.5 MG tablet Take 0.5 mg by mouth 4 (four) times daily as needed for anxiety.    . ALPRAZolam (XANAX) 1 MG tablet Take 1 mg by mouth 4 (four) times daily as needed for anxiety.    . budesonide-formoterol (SYMBICORT) 160-4.5 MCG/ACT inhaler Inhale 2 puffs into the lungs 2 (two) times daily.    . busPIRone (BUSPAR) 15 MG tablet Take 15 mg by mouth 3 (three) times daily. 2 tablets in am and 1 tablet afternoon and bedtime    . Cholecalciferol (VITAMIN D PO) Take 1 tablet by mouth daily.    . Cyanocobalamin (VITAMIN B-12 PO) Take 1 tablet by mouth daily.    . CYANOCOBALAMIN PO Take 1 tablet by mouth daily.    . cyclobenzaprine (FLEXERIL) 10 MG tablet Take 10 mg by mouth at bedtime.    Marland Kitchen dexlansoprazole (DEXILANT) 60 MG capsule Take 60 mg by mouth 2 (two) times daily.    . fluticasone (FLONASE) 50 MCG/ACT nasal spray Place 1 spray into both nostrils 2 (two) times daily.    Marland Kitchen gabapentin (NEURONTIN) 400 MG capsule Take 400 mg by mouth at bedtime.    . Linaclotide (LINZESS) 145 MCG CAPS capsule Take 145 mcg by mouth daily.    Marland Kitchen loratadine (CLARITIN) 10 MG tablet Take 10-20 mg by mouth daily.    . montelukast (SINGULAIR) 10 MG tablet Take 10 mg by mouth every morning.    . nitroGLYCERIN (NITROSTAT) 0.4 MG SL tablet Place 0.4 mg under the tongue every 5 (five) minutes as needed for chest pain.    . OxyCODONE (OXYCONTIN) 40 mg T12A 12 hr tablet Take 40 mg by mouth  every 8 (eight) hours as needed (for pain).    . Oxycodone HCl 10 MG TABS Take 10 mg by mouth 4 (four) times daily as needed (for break through pain).    . pramipexole (MIRAPEX) 1 MG tablet Take 1 mg by mouth at bedtime.    . promethazine (PHENERGAN) 25 MG tablet Take 25 mg by  mouth every 6 (six) hours as needed for nausea or vomiting.    . tiotropium (SPIRIVA) 18 MCG inhalation capsule Place 18 mcg into inhaler and inhale daily.    . traZODone (DESYREL) 50 MG tablet Take 100-150 mg by mouth at bedtime.     . varenicline (CHANTIX) 1 MG tablet Take 1 mg by mouth 2 (two) times daily.    Marland Kitchen oxyCODONE (ROXICODONE) 15 MG immediate release tablet Take 15 mg by mouth every 6 (six) hours as needed for pain.    . OxyCODONE HCl ER (OXYCONTIN) 60 MG T12A Take 60 mg by mouth 3 (three) times daily.    . pramipexole (MIRAPEX) 0.75 MG tablet Take 0.75 mg by mouth at bedtime.    . simvastatin (ZOCOR) 20 MG tablet Take 20 mg by mouth every evening.      No results found for this or any previous visit (from the past 48 hour(s)). No results found.  Positive for cervical issues tendinitis nasal congestion chronic cough  Blood pressure 161/73, pulse 82, temperature 98.4 F (36.9 C), temperature source Oral, resp. rate 18, weight 74.844 kg (165 lb), SpO2 97 %.  The patient is awake or and oriented. Her cranial nerve function is normal. Her strength is 5 over 5 sensation is intact. Assessment/Plan Impression is that of a right parasagittal meningioma. The plan is for a right craniotomy for removal.  Faythe Ghee, MD 09/08/2014, 10:11 AM

## 2014-09-08 NOTE — Op Note (Signed)
Preop diagnosis: Right frontal falcine meningioma with curve Postop diagnosis: Same Procedure: Right frontal craniotomy with removal of falcine meningioma Surgeon: Lambert Jeanty Asst.: Vertell Limber  After being placed in the supine position in semisitting position in 3-point pin fixation the patient's scalp was shaved prepped and draped in the usual sterile fashion. We entered our markers so that we could use the curve navigation system. We are able to easily identify the tumor and a good entry point and approach. We made a linear incision in the bicoronal fashion and then dissected the pericranium off of the scalp to help with exposure. We placed bur holes on both sides of the midline and then used the [place] 3 to separate the dura from the bone. We then fashion a craniotomy without difficulty remove the bone flap and no unusual bleeding was encountered. We continued to use our navigation to help with our approach. We opened the dura in a horseshoe shaped incision based on the midline on the right side midline. Any adhesions and small venous bridging veins were coagulated and dissected without difficulty. We easily able to approach the midline and the falx. We then used microdissection technique to dissect the brain away from the midline easily identified the tumor. There was a good plane between the tumor and the brain revealed to dissect free and all directions. We then began to dissect the tumor off of the falx and the few areas of adhesions were coagulated dissected free. Were able to remove the tumor and a single piece. There was some erosion along the falx but This residual tumor we coagulated those areas with bipolar coagulation. We then inspected the edge of the brain for any signs of cortical bleeding and none could be identified. We irrigated the wound copiously and placed Surgicel stamps over the exposed brain. We then closed the dura with interrupted Nurolon stitches placed Gelfoam over the exposed dura  particularly along the sagittal sinus. We reattached the bone flap with 3 plates. We irrigated along the way. We then closed the scalp with inverted Vicryl and put staples on the skin. A sterile dressing was then applied and the patient was extubated and taken to recovery room in stable condition.

## 2014-09-08 NOTE — Transfer of Care (Signed)
Immediate Anesthesia Transfer of Care Note  Patient: Shannon Morse  Procedure(s) Performed: Procedure(s) with comments: Right Frontal Craniotomy w/Curve (Right) - Right Frontal Craniotomy w/Curve  Patient Location: PACU  Anesthesia Type:General  Level of Consciousness: awake, alert  and oriented  Airway & Oxygen Therapy: Patient Spontanous Breathing and Patient connected to nasal cannula oxygen  Post-op Assessment: Report given to PACU RN, Post -op Vital signs reviewed and stable, Patient moving all extremities X 4 and Patient able to stick tongue midline  Post vital signs: Reviewed and stable  Complications: No apparent anesthesia complications

## 2014-09-09 ENCOUNTER — Inpatient Hospital Stay (HOSPITAL_COMMUNITY): Payer: Medicare Other

## 2014-09-09 ENCOUNTER — Encounter (HOSPITAL_COMMUNITY): Payer: Self-pay

## 2014-09-09 LAB — BASIC METABOLIC PANEL
ANION GAP: 12 (ref 5–15)
BUN: 10 mg/dL (ref 6–23)
CALCIUM: 9.2 mg/dL (ref 8.4–10.5)
CO2: 28 mEq/L (ref 19–32)
CREATININE: 0.55 mg/dL (ref 0.50–1.10)
Chloride: 99 mEq/L (ref 96–112)
Glucose, Bld: 138 mg/dL — ABNORMAL HIGH (ref 70–99)
Potassium: 3.9 mEq/L (ref 3.7–5.3)
SODIUM: 139 meq/L (ref 137–147)

## 2014-09-09 LAB — CBC
HCT: 34.4 % — ABNORMAL LOW (ref 36.0–46.0)
Hemoglobin: 11.5 g/dL — ABNORMAL LOW (ref 12.0–15.0)
MCH: 29.1 pg (ref 26.0–34.0)
MCHC: 33.4 g/dL (ref 30.0–36.0)
MCV: 87.1 fL (ref 78.0–100.0)
PLATELETS: 186 10*3/uL (ref 150–400)
RBC: 3.95 MIL/uL (ref 3.87–5.11)
RDW: 13.2 % (ref 11.5–15.5)
WBC: 12.6 10*3/uL — AB (ref 4.0–10.5)

## 2014-09-09 MED ORDER — IOHEXOL 300 MG/ML  SOLN
100.0000 mL | Freq: Once | INTRAMUSCULAR | Status: AC | PRN
  Administered 2014-09-09: 75 mL via INTRAVENOUS

## 2014-09-09 NOTE — Progress Notes (Signed)
Patient ID: Shannon Morse, female   DOB: [birth year], 57 y.o.   MRN: [id number] Afeb, vss No new neuro issues. Doing very well post op with no complaints. Wound clean and dry. CT this morning looks excellent. Will transfer to floor, and probably d/c tomorrow.

## 2014-09-09 NOTE — Progress Notes (Signed)
Patient transferred from step down, patient alert and oriented x 4. Patient oriented to the room. Will continue to monitor.

## 2014-09-10 MED ORDER — LEVETIRACETAM 500 MG PO TABS: 500.0000 mg | ORAL_TABLET | Freq: Two times a day (BID) | ORAL | Status: DC

## 2014-09-10 MED ORDER — DSS 100 MG PO CAPS: 100.0000 mg | ORAL_CAPSULE | Freq: Two times a day (BID) | ORAL | Status: DC

## 2014-09-10 NOTE — Progress Notes (Signed)
Patient been discharged to home with daughter. Condition stable at time of discharge. D/c instructions given, patient verbalizes understanding.   Odis Hollingshead RN-Student

## 2014-09-10 NOTE — Discharge Summary (Signed)
Physician Discharge Summary  Patient ID: Shannon Morse MRN: [id number] DOB/AGE: 1956/07/14 58 y.o.  Admit date: 09/08/2014 Discharge date: 09/10/2014  Admission Diagnoses: Right frontal meningioma  Discharge Diagnoses: The same Active Problems:   Cerebral meningioma   Discharged Condition: good  Hospital Course: Dr. Ramiro Harvest performed a right frontal craniotomy for resection of a right frontal meningioma on the patient on 09/08/2014.  The patient's postoperative course was unremarkable. On postoperative day #2 the patient requested discharge to home. The patient was given oral and written discharge instructions. All her questions were answered.  Consults: None Significant Diagnostic Studies: None Treatments: Right frontal craniotomy for resection of meningioma. Discharge Exam: Blood pressure 126/63, pulse 88, temperature 98.1 F (36.7 C), temperature source Oral, resp. rate 18, height 5\' 4"  (1.626 m), weight 79.4 kg (175 lb 0.7 oz), SpO2 96 %. The patient is alert and pleasant. She looks well. Her dressing has an old bloodstained. Her strength is normal. Her speech is normal.  Disposition: Home  Discharge Instructions    Call MD for:  difficulty breathing, headache or visual disturbances    Complete by:  As directed      Call MD for:  extreme fatigue    Complete by:  As directed      Call MD for:  hives    Complete by:  As directed      Call MD for:  persistant dizziness or light-headedness    Complete by:  As directed      Call MD for:  persistant nausea and vomiting    Complete by:  As directed      Call MD for:  redness, tenderness, or signs of infection (pain, swelling, redness, odor or green/yellow discharge around incision site)    Complete by:  As directed      Call MD for:  severe uncontrolled pain    Complete by:  As directed      Call MD for:  temperature >100.4    Complete by:  As directed      Diet - low sodium heart healthy    Complete by:  As directed       Discharge instructions    Complete by:  As directed   Call [phone] for a followup appointment. Take a stool softener while you are using pain medications.     Driving Restrictions    Complete by:  As directed   Do not drive for 2 weeks.     Increase activity slowly    Complete by:  As directed      Lifting restrictions    Complete by:  As directed   Do not lift more than 5 pounds. No excessive bending or twisting.     May shower / Bathe    Complete by:  As directed   He may shower after the pain she is removed 3 days after surgery. Leave the incision alone.     Remove dressing in 24 hours    Complete by:  As directed             Medication List    TAKE these medications        albuterol (2.5 MG/3ML) 0.083% nebulizer solution  Commonly known as:  PROVENTIL  Take 2.5 mg by nebulization every 6 (six) hours as needed for wheezing or shortness of breath.     albuterol 108 (90 BASE) MCG/ACT inhaler  Commonly known as:  PROVENTIL HFA;VENTOLIN HFA  Inhale 2 puffs into the lungs every  6 (six) hours as needed for wheezing or shortness of breath.     ALPRAZolam 1 MG tablet  Commonly known as:  XANAX  Take 1 mg by mouth 4 (four) times daily as needed for anxiety.     budesonide-formoterol 160-4.5 MCG/ACT inhaler  Commonly known as:  SYMBICORT  Inhale 2 puffs into the lungs 2 (two) times daily.     busPIRone 15 MG tablet  Commonly known as:  BUSPAR  Take 15 mg by mouth 3 (three) times daily. 2 tablets in am and 1 tablet afternoon and bedtime     CHANTIX 1 MG tablet  Generic drug:  varenicline  Take 1 mg by mouth 2 (two) times daily.     cyclobenzaprine 10 MG tablet  Commonly known as:  FLEXERIL  Take 10 mg by mouth at bedtime.     DEXILANT 60 MG capsule  Generic drug:  dexlansoprazole  Take 60 mg by mouth 2 (two) times daily.     DSS 100 MG Caps  Take 100 mg by mouth 2 (two) times daily.     fluticasone 50 MCG/ACT nasal spray  Commonly known as:  FLONASE   Place 1 spray into both nostrils 2 (two) times daily.     gabapentin 400 MG capsule  Commonly known as:  NEURONTIN  Take 400 mg by mouth at bedtime.     levETIRAcetam 500 MG tablet  Commonly known as:  KEPPRA  Take 1 tablet (500 mg total) by mouth 2 (two) times daily.     LINZESS 145 MCG Caps capsule  Generic drug:  Linaclotide  Take 145 mcg by mouth daily.     loratadine 10 MG tablet  Commonly known as:  CLARITIN  Take 10-20 mg by mouth daily.     montelukast 10 MG tablet  Commonly known as:  SINGULAIR  Take 10 mg by mouth every morning.     nitroGLYCERIN 0.4 MG SL tablet  Commonly known as:  NITROSTAT  Place 0.4 mg under the tongue every 5 (five) minutes as needed for chest pain.     Oxycodone HCl 10 MG Tabs  Take 10 mg by mouth 4 (four) times daily as needed (for break through pain).     oxyCODONE 15 MG immediate release tablet  Commonly known as:  ROXICODONE  Take 15 mg by mouth every 6 (six) hours as needed for pain.     pramipexole 1 MG tablet  Commonly known as:  MIRAPEX  Take 1 mg by mouth at bedtime.     promethazine 25 MG tablet  Commonly known as:  PHENERGAN  Take 25 mg by mouth every 6 (six) hours as needed for nausea or vomiting.     simvastatin 20 MG tablet  Commonly known as:  ZOCOR  Take 20 mg by mouth every evening.     tiotropium 18 MCG inhalation capsule  Commonly known as:  SPIRIVA  Place 18 mcg into inhaler and inhale daily.     traZODone 50 MG tablet  Commonly known as:  DESYREL  Take 100-150 mg by mouth at bedtime.     CYANOCOBALAMIN PO  Take 1 tablet by mouth daily.     VITAMIN B-12 PO  Take 1 tablet by mouth daily.     VITAMIN D PO  Take 1 tablet by mouth daily.         SignedOphelia Charter 09/10/2014, 10:27 AM

## 2014-09-14 ENCOUNTER — Encounter (HOSPITAL_COMMUNITY): Payer: Self-pay | Admitting: Neurosurgery

## 2014-09-29 ENCOUNTER — Encounter (HOSPITAL_COMMUNITY): Payer: Self-pay | Admitting: Cardiovascular Disease

## 2014-11-25 ENCOUNTER — Other Ambulatory Visit (HOSPITAL_BASED_OUTPATIENT_CLINIC_OR_DEPARTMENT_OTHER): Payer: Self-pay | Admitting: Neurosurgery

## 2014-11-25 DIAGNOSIS — D329 Benign neoplasm of meninges, unspecified: Secondary | ICD-10-CM

## 2014-12-03 ENCOUNTER — Ambulatory Visit (HOSPITAL_BASED_OUTPATIENT_CLINIC_OR_DEPARTMENT_OTHER)
Admission: RE | Admit: 2014-12-03 | Discharge: 2014-12-03 | Disposition: A | Discharge status: Home / Self Care | Payer: Medicare Other | Source: Ambulatory Visit | Attending: Neurosurgery | Admitting: Neurosurgery

## 2014-12-03 DIAGNOSIS — D329 Benign neoplasm of meninges, unspecified: Secondary | ICD-10-CM | POA: Diagnosis not present

## 2014-12-03 DIAGNOSIS — R51 Headache: Secondary | ICD-10-CM | POA: Diagnosis not present

## 2014-12-03 MED ORDER — GADOBENATE DIMEGLUMINE 529 MG/ML IV SOLN: 16.0000 mL/kg | Freq: Once | INTRAVENOUS | Status: DC

## 2014-12-03 MED ORDER — GADOBENATE DIMEGLUMINE 529 MG/ML IV SOLN: 16.0000 mL/kg | Freq: Once | INTRAVENOUS | Status: AC | PRN

## 2015-01-12 ENCOUNTER — Other Ambulatory Visit (HOSPITAL_BASED_OUTPATIENT_CLINIC_OR_DEPARTMENT_OTHER): Payer: Self-pay | Admitting: Neurosurgery

## 2015-01-12 DIAGNOSIS — M5412 Radiculopathy, cervical region: Secondary | ICD-10-CM

## 2015-02-11 ENCOUNTER — Ambulatory Visit (HOSPITAL_BASED_OUTPATIENT_CLINIC_OR_DEPARTMENT_OTHER)
Admission: RE | Admit: 2015-02-11 | Discharge: 2015-02-11 | Disposition: A | Discharge status: Home / Self Care | Payer: Medicare Other | Source: Ambulatory Visit | Attending: Neurosurgery | Admitting: Neurosurgery

## 2015-02-11 DIAGNOSIS — M5021 Other cervical disc displacement,  high cervical region: Secondary | ICD-10-CM | POA: Diagnosis not present

## 2015-02-11 DIAGNOSIS — M4322 Fusion of spine, cervical region: Secondary | ICD-10-CM | POA: Diagnosis not present

## 2015-02-11 DIAGNOSIS — R531 Weakness: Secondary | ICD-10-CM | POA: Insufficient documentation

## 2015-02-11 DIAGNOSIS — M5412 Radiculopathy, cervical region: Secondary | ICD-10-CM

## 2015-02-11 DIAGNOSIS — M542 Cervicalgia: Secondary | ICD-10-CM | POA: Diagnosis present

## 2015-11-01 ENCOUNTER — Inpatient Hospital Stay (HOSPITAL_COMMUNITY): Payer: Medicare Other

## 2015-11-01 ENCOUNTER — Inpatient Hospital Stay (HOSPITAL_BASED_OUTPATIENT_CLINIC_OR_DEPARTMENT_OTHER)
Admission: EM | Admit: 2015-11-01 | Discharge: 2015-11-05 | DRG: 190 | Disposition: A | Discharge status: Home / Self Care | Payer: Medicare Other | Attending: Internal Medicine | Admitting: Internal Medicine

## 2015-11-01 ENCOUNTER — Encounter (HOSPITAL_BASED_OUTPATIENT_CLINIC_OR_DEPARTMENT_OTHER): Payer: Self-pay | Admitting: *Deleted

## 2015-11-01 ENCOUNTER — Emergency Department (HOSPITAL_BASED_OUTPATIENT_CLINIC_OR_DEPARTMENT_OTHER): Payer: Medicare Other

## 2015-11-01 DIAGNOSIS — Z90710 Acquired absence of both cervix and uterus: Secondary | ICD-10-CM

## 2015-11-01 DIAGNOSIS — I472 Ventricular tachycardia: Secondary | ICD-10-CM | POA: Diagnosis present

## 2015-11-01 DIAGNOSIS — Z883 Allergy status to other anti-infective agents status: Secondary | ICD-10-CM | POA: Diagnosis not present

## 2015-11-01 DIAGNOSIS — J309 Allergic rhinitis, unspecified: Secondary | ICD-10-CM | POA: Diagnosis present

## 2015-11-01 DIAGNOSIS — J9621 Acute and chronic respiratory failure with hypoxia: Secondary | ICD-10-CM | POA: Diagnosis present

## 2015-11-01 DIAGNOSIS — K219 Gastro-esophageal reflux disease without esophagitis: Secondary | ICD-10-CM | POA: Diagnosis present

## 2015-11-01 DIAGNOSIS — E669 Obesity, unspecified: Secondary | ICD-10-CM | POA: Diagnosis present

## 2015-11-01 DIAGNOSIS — J45909 Unspecified asthma, uncomplicated: Secondary | ICD-10-CM | POA: Diagnosis present

## 2015-11-01 DIAGNOSIS — Z6827 Body mass index (BMI) 27.0-27.9, adult: Secondary | ICD-10-CM | POA: Diagnosis not present

## 2015-11-01 DIAGNOSIS — E119 Type 2 diabetes mellitus without complications: Secondary | ICD-10-CM

## 2015-11-01 DIAGNOSIS — I340 Nonrheumatic mitral (valve) insufficiency: Secondary | ICD-10-CM | POA: Diagnosis present

## 2015-11-01 DIAGNOSIS — G4733 Obstructive sleep apnea (adult) (pediatric): Secondary | ICD-10-CM | POA: Diagnosis present

## 2015-11-01 DIAGNOSIS — Z882 Allergy status to sulfonamides status: Secondary | ICD-10-CM | POA: Diagnosis not present

## 2015-11-01 DIAGNOSIS — I129 Hypertensive chronic kidney disease with stage 1 through stage 4 chronic kidney disease, or unspecified chronic kidney disease: Secondary | ICD-10-CM | POA: Diagnosis present

## 2015-11-01 DIAGNOSIS — E876 Hypokalemia: Secondary | ICD-10-CM | POA: Diagnosis present

## 2015-11-01 DIAGNOSIS — E785 Hyperlipidemia, unspecified: Secondary | ICD-10-CM | POA: Diagnosis present

## 2015-11-01 DIAGNOSIS — I10 Essential (primary) hypertension: Secondary | ICD-10-CM | POA: Diagnosis not present

## 2015-11-01 DIAGNOSIS — J189 Pneumonia, unspecified organism: Secondary | ICD-10-CM | POA: Diagnosis present

## 2015-11-01 DIAGNOSIS — Z886 Allergy status to analgesic agent status: Secondary | ICD-10-CM

## 2015-11-01 DIAGNOSIS — G2581 Restless legs syndrome: Secondary | ICD-10-CM | POA: Diagnosis present

## 2015-11-01 DIAGNOSIS — J440 Chronic obstructive pulmonary disease with acute lower respiratory infection: Principal | ICD-10-CM | POA: Diagnosis present

## 2015-11-01 DIAGNOSIS — J441 Chronic obstructive pulmonary disease with (acute) exacerbation: Secondary | ICD-10-CM | POA: Diagnosis present

## 2015-11-01 DIAGNOSIS — G8929 Other chronic pain: Secondary | ICD-10-CM | POA: Diagnosis present

## 2015-11-01 DIAGNOSIS — G40909 Epilepsy, unspecified, not intractable, without status epilepticus: Secondary | ICD-10-CM | POA: Diagnosis present

## 2015-11-01 DIAGNOSIS — D649 Anemia, unspecified: Secondary | ICD-10-CM | POA: Diagnosis present

## 2015-11-01 DIAGNOSIS — N184 Chronic kidney disease, stage 4 (severe): Secondary | ICD-10-CM | POA: Diagnosis present

## 2015-11-01 DIAGNOSIS — J449 Chronic obstructive pulmonary disease, unspecified: Secondary | ICD-10-CM | POA: Diagnosis present

## 2015-11-01 DIAGNOSIS — R0602 Shortness of breath: Secondary | ICD-10-CM | POA: Diagnosis present

## 2015-11-01 DIAGNOSIS — M810 Age-related osteoporosis without current pathological fracture: Secondary | ICD-10-CM | POA: Diagnosis present

## 2015-11-01 DIAGNOSIS — F419 Anxiety disorder, unspecified: Secondary | ICD-10-CM | POA: Diagnosis present

## 2015-11-01 DIAGNOSIS — R000 Tachycardia, unspecified: Secondary | ICD-10-CM | POA: Diagnosis not present

## 2015-11-01 DIAGNOSIS — E1122 Type 2 diabetes mellitus with diabetic chronic kidney disease: Secondary | ICD-10-CM | POA: Diagnosis present

## 2015-11-01 DIAGNOSIS — J45901 Unspecified asthma with (acute) exacerbation: Secondary | ICD-10-CM

## 2015-11-01 DIAGNOSIS — Z87891 Personal history of nicotine dependence: Secondary | ICD-10-CM | POA: Diagnosis not present

## 2015-11-01 DIAGNOSIS — Z9981 Dependence on supplemental oxygen: Secondary | ICD-10-CM | POA: Diagnosis not present

## 2015-11-01 HISTORY — DX: Headache: R51

## 2015-11-01 HISTORY — DX: Unspecified chronic bronchitis: J42

## 2015-11-01 HISTORY — DX: Chronic kidney disease, stage 4 (severe): N18.4

## 2015-11-01 HISTORY — DX: Headache, unspecified: R51.9

## 2015-11-01 HISTORY — DX: Unspecified osteoarthritis, unspecified site: M19.90

## 2015-11-01 LAB — BASIC METABOLIC PANEL
Anion gap: 10 (ref 5–15)
BUN: 12 mg/dL (ref 6–20)
CO2: 38 mmol/L — ABNORMAL HIGH (ref 22–32)
Calcium: 9.4 mg/dL (ref 8.9–10.3)
Chloride: 95 mmol/L — ABNORMAL LOW (ref 101–111)
Creatinine, Ser: 0.58 mg/dL (ref 0.44–1.00)
GFR calc Af Amer: >60 mL/min (ref >60)
Glucose, Bld: 133 mg/dL — ABNORMAL HIGH (ref 65–99)
Potassium: 3.2 mmol/L — ABNORMAL LOW (ref 3.5–5.1)
Sodium: 143 mmol/L (ref 135–145)

## 2015-11-01 LAB — CBC WITH DIFFERENTIAL/PLATELET
Basophils Absolute: 0.1 10*3/uL (ref 0.0–0.1)
Basophils Relative: 1 %
Eosinophils Absolute: 0 10*3/uL (ref 0.0–0.7)
Eosinophils Relative: 0 %
HEMATOCRIT: 37.9 % (ref 36.0–46.0)
HEMOGLOBIN: 11.9 g/dL — AB (ref 12.0–15.0)
LYMPHS PCT: 10 %
Lymphs Abs: 1 10*3/uL (ref 0.7–4.0)
MCH: 29.5 pg (ref 26.0–34.0)
MCHC: 31.4 g/dL (ref 30.0–36.0)
MCV: 93.8 fL (ref 78.0–100.0)
Monocytes Absolute: 0.4 10*3/uL (ref 0.1–1.0)
Monocytes Relative: 4 %
NEUTROS PCT: 85 %
Neutro Abs: 7.9 10*3/uL — ABNORMAL HIGH (ref 1.7–7.7)
Platelets: 235 10*3/uL (ref 150–400)
RBC: 4.04 MIL/uL (ref 3.87–5.11)
RDW: 13.1 % (ref 11.5–15.5)
WBC: 9.4 10*3/uL (ref 4.0–10.5)

## 2015-11-01 LAB — CBC
HCT: 36.5 % (ref 36.0–46.0)
HEMOGLOBIN: 11.3 g/dL — AB (ref 12.0–15.0)
MCH: 28.7 pg (ref 26.0–34.0)
MCHC: 31 g/dL (ref 30.0–36.0)
MCV: 92.6 fL (ref 78.0–100.0)
Platelets: 234 10*3/uL (ref 150–400)
RBC: 3.94 MIL/uL (ref 3.87–5.11)
RDW: 13.4 % (ref 11.5–15.5)
WBC: 7.3 10*3/uL (ref 4.0–10.5)

## 2015-11-01 LAB — MAGNESIUM: Magnesium: 2 mg/dL (ref 1.7–2.4)

## 2015-11-01 LAB — CREATININE, SERUM: Creatinine, Ser: 0.62 mg/dL (ref 0.44–1.00)

## 2015-11-01 LAB — TROPONIN I

## 2015-11-01 LAB — GLUCOSE, CAPILLARY: GLUCOSE-CAPILLARY: 229 mg/dL — AB (ref 65–99)

## 2015-11-01 MED ORDER — POTASSIUM CHLORIDE CRYS ER 20 MEQ PO TBCR
40.0000 meq | EXTENDED_RELEASE_TABLET | Freq: Once | ORAL | Status: AC
Start: 2015-11-01 — End: 2015-11-01
  Administered 2015-11-01: 40 meq via ORAL
  Filled 2015-11-01: qty 2

## 2015-11-01 MED ORDER — ACETAMINOPHEN 325 MG PO TABS: 650.0000 mg | ORAL_TABLET | Freq: Four times a day (QID) | ORAL | Status: DC | PRN

## 2015-11-01 MED ORDER — LINACLOTIDE 145 MCG PO CAPS
145.0000 ug | ORAL_CAPSULE | Freq: Every day | ORAL | Status: DC
Start: 2015-11-01 — End: 2015-11-05
  Administered 2015-11-01 – 2015-11-05 (×3): 145 ug via ORAL
  Filled 2015-11-01 (×5): qty 1

## 2015-11-01 MED ORDER — PANTOPRAZOLE SODIUM 40 MG PO TBEC: 40.0000 mg | DELAYED_RELEASE_TABLET | Freq: Every day | ORAL | Status: DC

## 2015-11-01 MED ORDER — HYDROCOD POLST-CPM POLST ER 10-8 MG/5ML PO SUER: 5.0000 mL | Freq: Two times a day (BID) | ORAL | Status: DC | PRN

## 2015-11-01 MED ORDER — ONDANSETRON HCL 4 MG PO TABS: 4.0000 mg | ORAL_TABLET | Freq: Four times a day (QID) | ORAL | Status: DC | PRN

## 2015-11-01 MED ORDER — LEVETIRACETAM 500 MG PO TABS
500.0000 mg | ORAL_TABLET | Freq: Two times a day (BID) | ORAL | Status: DC
  Filled 2015-11-01 (×6): qty 1

## 2015-11-01 MED ORDER — DOCUSATE SODIUM 100 MG PO CAPS
100.0000 mg | ORAL_CAPSULE | Freq: Two times a day (BID) | ORAL | Status: DC
  Administered 2015-11-01 – 2015-11-03 (×4): 100 mg via ORAL
  Filled 2015-11-01 (×8): qty 1

## 2015-11-01 MED ORDER — ALBUTEROL SULFATE (2.5 MG/3ML) 0.083% IN NEBU
2.5000 mg | INHALATION_SOLUTION | Freq: Once | RESPIRATORY_TRACT | Status: AC
  Administered 2015-11-01: 2.5 mg via RESPIRATORY_TRACT
  Filled 2015-11-01: qty 3

## 2015-11-01 MED ORDER — OXYCODONE HCL 5 MG PO TABS: 15.0000 mg | ORAL_TABLET | Freq: Four times a day (QID) | ORAL | Status: DC | PRN

## 2015-11-01 MED ORDER — ACETAMINOPHEN 650 MG RE SUPP: 650.0000 mg | Freq: Four times a day (QID) | RECTAL | Status: DC | PRN

## 2015-11-01 MED ORDER — ENSURE ENLIVE PO LIQD
237.0000 mL | Freq: Two times a day (BID) | ORAL | Status: DC
  Administered 2015-11-02: 237 mL via ORAL

## 2015-11-01 MED ORDER — GUAIFENESIN ER 600 MG PO TB12
600.0000 mg | ORAL_TABLET | Freq: Two times a day (BID) | ORAL | Status: DC
  Administered 2015-11-01: 600 mg via ORAL
  Filled 2015-11-01: qty 1

## 2015-11-01 MED ORDER — SIMVASTATIN 20 MG PO TABS
20.0000 mg | ORAL_TABLET | Freq: Every evening | ORAL | Status: DC
  Administered 2015-11-01 – 2015-11-04 (×4): 20 mg via ORAL
  Filled 2015-11-01 (×4): qty 1

## 2015-11-01 MED ORDER — INSULIN ASPART 100 UNIT/ML ~~LOC~~ SOLN
0.0000 [IU] | Freq: Three times a day (TID) | SUBCUTANEOUS | Status: DC
  Administered 2015-11-03: 1 [IU] via SUBCUTANEOUS
  Administered 2015-11-04: 2 [IU] via SUBCUTANEOUS
  Administered 2015-11-05: 3 [IU] via SUBCUTANEOUS

## 2015-11-01 MED ORDER — GABAPENTIN 400 MG PO CAPS
400.0000 mg | ORAL_CAPSULE | Freq: Every day | ORAL | Status: DC
  Administered 2015-11-01 – 2015-11-04 (×4): 400 mg via ORAL
  Filled 2015-11-01 (×4): qty 1

## 2015-11-01 MED ORDER — SODIUM CHLORIDE 0.9 % IJ SOLN
3.0000 mL | Freq: Two times a day (BID) | INTRAMUSCULAR | Status: DC
  Administered 2015-11-03 – 2015-11-05 (×3): 3 mL via INTRAVENOUS

## 2015-11-01 MED ORDER — IPRATROPIUM-ALBUTEROL 0.5-2.5 (3) MG/3ML IN SOLN
3.0000 mL | Freq: Four times a day (QID) | RESPIRATORY_TRACT | Status: DC
  Administered 2015-11-01 – 2015-11-04 (×13): 3 mL via RESPIRATORY_TRACT
  Filled 2015-11-01 (×14): qty 3

## 2015-11-01 MED ORDER — POTASSIUM CHLORIDE IN NACL 20-0.9 MEQ/L-% IV SOLN
INTRAVENOUS | Status: DC
  Administered 2015-11-01: 20:00:00 via INTRAVENOUS
  Filled 2015-11-01: qty 1000

## 2015-11-01 MED ORDER — ONDANSETRON HCL 4 MG/2ML IJ SOLN: 4.0000 mg | Freq: Four times a day (QID) | INTRAMUSCULAR | Status: DC | PRN

## 2015-11-01 MED ORDER — IPRATROPIUM-ALBUTEROL 0.5-2.5 (3) MG/3ML IN SOLN: 3.0000 mL | RESPIRATORY_TRACT | Status: DC | PRN

## 2015-11-01 MED ORDER — LEVALBUTEROL HCL 0.63 MG/3ML IN NEBU
0.6300 mg | INHALATION_SOLUTION | Freq: Four times a day (QID) | RESPIRATORY_TRACT | Status: DC | PRN
  Administered 2015-11-02: 0.63 mg via RESPIRATORY_TRACT
  Filled 2015-11-01: qty 3

## 2015-11-01 MED ORDER — BUDESONIDE-FORMOTEROL FUMARATE 160-4.5 MCG/ACT IN AERO
2.0000 | INHALATION_SPRAY | Freq: Two times a day (BID) | RESPIRATORY_TRACT | Status: DC
  Administered 2015-11-01 – 2015-11-05 (×8): 2 via RESPIRATORY_TRACT
  Filled 2015-11-01: qty 6

## 2015-11-01 MED ORDER — PRAMIPEXOLE DIHYDROCHLORIDE 1 MG PO TABS
1.0000 mg | ORAL_TABLET | Freq: Every day | ORAL | Status: DC
  Administered 2015-11-01 – 2015-11-04 (×4): 1 mg via ORAL
  Filled 2015-11-01 (×5): qty 1

## 2015-11-01 MED ORDER — CYCLOBENZAPRINE HCL 10 MG PO TABS
10.0000 mg | ORAL_TABLET | Freq: Every day | ORAL | Status: DC
  Administered 2015-11-01 – 2015-11-04 (×4): 10 mg via ORAL
  Filled 2015-11-01 (×4): qty 1

## 2015-11-01 MED ORDER — GI COCKTAIL ~~LOC~~
30.0000 mL | Freq: Three times a day (TID) | ORAL | Status: DC | PRN
  Administered 2015-11-02 – 2015-11-05 (×3): 30 mL via ORAL
  Filled 2015-11-01 (×6): qty 30

## 2015-11-01 MED ORDER — MONTELUKAST SODIUM 10 MG PO TABS
10.0000 mg | ORAL_TABLET | ORAL | Status: DC
  Administered 2015-11-02 – 2015-11-05 (×4): 10 mg via ORAL
  Filled 2015-11-01 (×4): qty 1

## 2015-11-01 MED ORDER — PANTOPRAZOLE SODIUM 40 MG PO TBEC
40.0000 mg | DELAYED_RELEASE_TABLET | Freq: Every day | ORAL | Status: DC
  Administered 2015-11-02 – 2015-11-03 (×2): 40 mg via ORAL
  Filled 2015-11-01 (×3): qty 1

## 2015-11-01 MED ORDER — DEXTROSE 5 % IV SOLN
1.0000 g | INTRAVENOUS | Status: DC
  Administered 2015-11-01 – 2015-11-04 (×4): 1 g via INTRAVENOUS
  Filled 2015-11-01 (×5): qty 10

## 2015-11-01 MED ORDER — ALBUTEROL (5 MG/ML) CONTINUOUS INHALATION SOLN
INHALATION_SOLUTION | RESPIRATORY_TRACT | Status: AC
  Administered 2015-11-01: 10 mg/h via RESPIRATORY_TRACT
  Filled 2015-11-01: qty 20

## 2015-11-01 MED ORDER — METHYLPREDNISOLONE SODIUM SUCC 125 MG IJ SOLR
60.0000 mg | Freq: Four times a day (QID) | INTRAMUSCULAR | Status: DC
  Administered 2015-11-01 – 2015-11-04 (×11): 60 mg via INTRAVENOUS
  Filled 2015-11-01 (×11): qty 2

## 2015-11-01 MED ORDER — DEXTROSE 5 % IV SOLN
500.0000 mg | INTRAVENOUS | Status: DC
  Administered 2015-11-01 – 2015-11-02 (×2): 500 mg via INTRAVENOUS
  Filled 2015-11-01 (×3): qty 500

## 2015-11-01 MED ORDER — FLUTICASONE PROPIONATE 50 MCG/ACT NA SUSP
1.0000 | Freq: Two times a day (BID) | NASAL | Status: DC
  Administered 2015-11-02 – 2015-11-05 (×5): 1 via NASAL
  Filled 2015-11-01: qty 16

## 2015-11-01 MED ORDER — TIOTROPIUM BROMIDE MONOHYDRATE 18 MCG IN CAPS
18.0000 ug | ORAL_CAPSULE | Freq: Every day | RESPIRATORY_TRACT | Status: DC
  Administered 2015-11-01: 18 ug via RESPIRATORY_TRACT
  Filled 2015-11-01: qty 5

## 2015-11-01 MED ORDER — HYDROCODONE-HOMATROPINE 5-1.5 MG/5ML PO SYRP
5.0000 mL | ORAL_SOLUTION | Freq: Four times a day (QID) | ORAL | Status: DC | PRN
  Administered 2015-11-01 – 2015-11-03 (×3): 5 mL via ORAL
  Filled 2015-11-01 (×3): qty 5

## 2015-11-01 MED ORDER — PROMETHAZINE HCL 25 MG PO TABS: 25.0000 mg | ORAL_TABLET | Freq: Four times a day (QID) | ORAL | Status: DC | PRN

## 2015-11-01 MED ORDER — ENOXAPARIN SODIUM 40 MG/0.4ML ~~LOC~~ SOLN
40.0000 mg | SUBCUTANEOUS | Status: DC
  Administered 2015-11-01 – 2015-11-04 (×4): 40 mg via SUBCUTANEOUS
  Filled 2015-11-01 (×4): qty 0.4

## 2015-11-01 MED ORDER — SODIUM CHLORIDE 0.9 % IV SOLN
INTRAVENOUS | Status: DC
  Administered 2015-11-01 – 2015-11-02 (×2): via INTRAVENOUS

## 2015-11-01 MED ORDER — OXYCODONE HCL 5 MG PO TABS
15.0000 mg | ORAL_TABLET | Freq: Four times a day (QID) | ORAL | Status: DC | PRN
  Administered 2015-11-01 – 2015-11-03 (×4): 15 mg via ORAL
  Filled 2015-11-01 (×4): qty 3

## 2015-11-01 MED ORDER — TRAZODONE HCL 100 MG PO TABS
200.0000 mg | ORAL_TABLET | Freq: Every day | ORAL | Status: DC
  Administered 2015-11-01 – 2015-11-04 (×4): 200 mg via ORAL
  Filled 2015-11-01 (×4): qty 2

## 2015-11-01 MED ORDER — METHYLPREDNISOLONE SODIUM SUCC 125 MG IJ SOLR
125.0000 mg | Freq: Once | INTRAMUSCULAR | Status: AC
  Administered 2015-11-01: 125 mg via INTRAVENOUS
  Filled 2015-11-01: qty 2

## 2015-11-01 MED ORDER — IOHEXOL 350 MG/ML SOLN
80.0000 mL | Freq: Once | INTRAVENOUS | Status: AC | PRN
  Administered 2015-11-01: 100 mL via INTRAVENOUS

## 2015-11-01 MED ORDER — BUSPIRONE HCL 15 MG PO TABS
15.0000 mg | ORAL_TABLET | Freq: Three times a day (TID) | ORAL | Status: DC
  Administered 2015-11-01 – 2015-11-05 (×12): 15 mg via ORAL
  Filled 2015-11-01 (×12): qty 1

## 2015-11-01 NOTE — Progress Notes (Signed)
Paged Lamar Blinks, on call for [facility] to notify her of patient's 5 Beat run of v tach.  Pt complaining of chest pain associated with coughing .

## 2015-11-01 NOTE — ED Notes (Signed)
[facility] at bedside for transport. 

## 2015-11-01 NOTE — Progress Notes (Signed)
MD notified of pt's arrival to floor.  Shannon Morse  

## 2015-11-01 NOTE — ED Notes (Signed)
EMS reports the patient has a one week history of sob.  Was seen by her PCP one week ago and given a steroid injection.  Symptoms has progressively gotten worse. Nebulizer given by EMS of albuterol 10 mg and atrovent 0.5mg  with minimal relief.

## 2015-11-01 NOTE — H&P (Signed)
[facility] History and Physical  Shannon Morse [id number] DOB: 1956-09-26 DOA: 11/01/2015  Referring physician:  PCP: Patric Dykes, MD   Chief Complaint:  Shortness of breath   HPI:  60 year old female with a history of  Prior cardiac catheterization with normal coronaries, COPD, mitral insufficiency, allergic rhinitis, gastroesophageal reflux disease who presents to the ER [facility] to [facility] , with a history of one week shortness of breath progressively getting worse. She received steroid injection at her PCP office one week ago and has progressively gotten worse. Symptoms relieved with nebulizer treatment in the ER. Present history of COPD on 2 L of oxygen at all times during the day. EKG consistent with sinus tachycardia. Chest x-ray negative for pneumonia, pneumothorax, pulmonary edema. Patient received IV Solu-Medrol nebulizer treatments and transferred to [facility] for further assessment.       Review of Systems: negative for the following  Constitutional: Denies fever, chills, diaphoresis, appetite change and fatigue.  HEENT: Denies photophobia, eye pain, redness, hearing loss, ear pain, congestion, sore throat, rhinorrhea, sneezing, mouth sores, trouble swallowing, neck pain, neck stiffness and tinnitus.   Respiratory: Positive for shortness of breath and wheezing.  Cardiovascular: Denies chest pain, palpitations and leg swelling.  Gastrointestinal: Denies nausea, vomiting, abdominal pain, diarrhea, constipation, blood in stool and abdominal distention.  Genitourinary: Denies dysuria, urgency, frequency, hematuria, flank pain and difficulty urinating.  Musculoskeletal: Denies myalgias, back pain, joint swelling, arthralgias and gait problem.  Skin: Denies pallor, rash and wound.  Neurological: Denies dizziness, seizures, syncope, weakness, light-headedness, numbness and headaches.  Hematological: Denies adenopathy. Easy bruising, personal or family bleeding  history  Psychiatric/Behavioral: Denies suicidal ideation, mood changes, confusion, nervousness, sleep disturbance and agitation       Past Medical History  Diagnosis Date  . Anemia   . GERD (gastroesophageal reflux disease)   . COPD (chronic obstructive pulmonary disease) ([place])   . Barrett's esophagus   . Obesity   . Allergic rhinitis   . Gastritis   . Vitamin B12 deficiency   . Tobacco abuse   . HSV (herpes simplex virus) infection   . Mitral regurgitation     mild to moderate by echo 06/2013  . DDD (degenerative disc disease)   . Osteoporosis   . Chronic pain   . DJD (degenerative joint disease)   . Restless leg syndrome   . Anxiety   . Depression   . Insomnia   . Hypertension     hx of; PCP took pt off of BP meds  . Constipation due to pain medication   . Bruises easily   . Seasonal allergies   . Sleep apnea     does not wear machine d/t fiancial costs  . On home oxygen therapy     3 Liters nightly & PRN  . Coronary artery disease     normal coronaries by 03/2013 cath  . Anginal pain ([place])     with normal coronaries by cath 03/2013  . Shortness of breath     with exertion; cannot lay flat  . Asthma   . Pneumonia     hx  . Chronic kidney disease     stage 4 kidney disease? does not see kidney doc  . Headache   . Neuromuscular disorder [facility])      Past Surgical History  Procedure Laterality Date  . Tonsillectomy    . Anterior cervical decomp/discectomy fusion      C5-C6 anterior discectomy with fusion  . Carpal tunnel release    .  Cystoscopy vaginoscopy w/ vaginal dilation    . Cataract extraction w/ intraocular lens  implant, bilateral Bilateral     cataract extraction-bilateral  . Esophagogastroduodenoscopy endoscopy    . Tee without cardioversion N/A 07/19/2013    Procedure: TRANSESOPHAGEAL ECHOCARDIOGRAM (TEE);  Surgeon: Sanda Klein, MD;  Location: [facility] ENDOSCOPY;  Service: Cardiovascular;  Laterality: N/A;  . Cardiac catheterization  2014    2009   . Abdominal hysterectomy    . Colonoscopy    . Cholecystectomy    . Ankle surgery Left   . Anterior cervical decomp/discectomy fusion N/A 09/28/2013    Procedure: CERVICAL FOUR-FIVE, CERVICAL SIX-SEVEN ANTERIOR CERVICAL DECOMPRESSION/DISCECTOMY FUSION 2 LEVELS;  Surgeon: Faythe Ghee, MD;  Location: [facility] NEURO ORS;  Service: Neurosurgery;  Laterality: N/A;  . Craniotomy Right 09/08/2014    Procedure: Right Frontal Craniotomy w/Curve;  Surgeon: Faythe Ghee, MD;  Location: [place] NEURO ORS;  Service: Neurosurgery;  Laterality: Right;  Right Frontal Craniotomy w/Curve  . Left heart catheterization with coronary angiogram N/A 03/29/2013    Procedure: LEFT HEART CATHETERIZATION WITH CORONARY ANGIOGRAM;  Surgeon: Lorretta Harp, MD;  Location: [facility] CATH LAB;  Service: Cardiovascular;  Laterality: N/A;  . Back surgery        Social History:  reports that she has quit smoking. Her smoking use included Cigarettes. She quit after 40 years of use. She quit smokeless tobacco use about 3 years ago. She reports that she drinks alcohol. She reports that she does not use illicit drugs.    Allergies  Allergen Reactions  . Aspirin Other (See Comments)    Can not take high doses  . Avelox [Moxifloxacin] Hives  . [place] Other (See Comments)    Childhood broke out in welps  (doesn't eat)  . Sulfa Antibiotics Nausea And Vomiting  . Avelox [Moxifloxacin Hcl In Nacl] Rash    Family History  Problem Relation Age of Onset  . Coronary artery disease Mother 56    MI  . Coronary artery disease Father     MI  . Coronary artery disease Sister 63    MI  . Heart failure Father         Prior to Admission medications   Medication Sig Start Date End Date Taking? Authorizing Provider  albuterol (PROVENTIL HFA;VENTOLIN HFA) 108 (90 BASE) MCG/ACT inhaler Inhale 2 puffs into the lungs every 6 (six) hours as needed for wheezing or shortness of breath.   Yes Historical Provider, MD  albuterol (PROVENTIL) (2.5  MG/3ML) 0.083% nebulizer solution Take 2.5 mg by nebulization every 6 (six) hours as needed for wheezing or shortness of breath.   Yes Historical Provider, MD  ALPRAZolam Duanne Moron) 1 MG tablet Take 1 mg by mouth 4 (four) times daily as needed for anxiety.   Yes Historical Provider, MD  budesonide-formoterol (SYMBICORT) 160-4.5 MCG/ACT inhaler Inhale 2 puffs into the lungs 2 (two) times daily.   Yes Historical Provider, MD  busPIRone (BUSPAR) 15 MG tablet Take 15 mg by mouth 3 (three) times daily. 2 tablets in am and 1 tablet afternoon and bedtime   Yes Historical Provider, MD  Cholecalciferol (VITAMIN D PO) Take 1 tablet by mouth daily.   Yes Historical Provider, MD  cyclobenzaprine (FLEXERIL) 10 MG tablet Take 10 mg by mouth at bedtime.   Yes Historical Provider, MD  dexlansoprazole (DEXILANT) 60 MG capsule Take 60 mg by mouth 2 (two) times daily.   Yes Historical Provider, MD  docusate sodium 100 MG CAPS Take 100 mg by  mouth 2 (two) times daily. 09/10/14  Yes Newman Pies, MD  fluticasone [facility]) 50 MCG/ACT nasal spray Place 1 spray into both nostrils 2 (two) times daily.   Yes Historical Provider, MD  gabapentin (NEURONTIN) 400 MG capsule Take 400 mg by mouth at bedtime.   Yes Historical Provider, MD  Linaclotide Rolan Lipa) 145 MCG CAPS capsule Take 145 mcg by mouth daily.   Yes Historical Provider, MD  loratadine (CLARITIN) 10 MG tablet Take 10-20 mg by mouth daily.   Yes Historical Provider, MD  montelukast (SINGULAIR) 10 MG tablet Take 10 mg by mouth every morning.   Yes Historical Provider, MD  oxyCODONE (ROXICODONE) 15 MG immediate release tablet Take 15 mg by mouth every 6 (six) hours as needed for pain.   Yes Historical Provider, MD  Oxycodone HCl 10 MG TABS Take 10 mg by mouth 4 (four) times daily as needed (for break through pain).   Yes Historical Provider, MD  pramipexole (MIRAPEX) 1 MG tablet Take 1 mg by mouth at bedtime.   Yes Historical Provider, MD  promethazine (PHENERGAN) 25 MG  tablet Take 25 mg by mouth every 6 (six) hours as needed for nausea or vomiting.   Yes Historical Provider, MD  simvastatin (ZOCOR) 20 MG tablet Take 20 mg by mouth every evening.   Yes Historical Provider, MD  tiotropium (SPIRIVA) 18 MCG inhalation capsule Place 18 mcg into inhaler and inhale daily.   Yes Historical Provider, MD  traZODone (DESYREL) 50 MG tablet Take 100-150 mg by mouth at bedtime.    Yes Historical Provider, MD  varenicline (CHANTIX) 1 MG tablet Take 1 mg by mouth 2 (two) times daily.   Yes Historical Provider, MD  Cyanocobalamin (VITAMIN B-12 PO) Take 1 tablet by mouth daily.    Historical Provider, MD  CYANOCOBALAMIN PO Take 1 tablet by mouth daily.    Historical Provider, MD  levETIRAcetam (KEPPRA) 500 MG tablet Take 1 tablet (500 mg total) by mouth 2 (two) times daily. 09/10/14   Newman Pies, MD  nitroGLYCERIN (NITROSTAT) 0.4 MG SL tablet Place 0.4 mg under the tongue every 5 (five) minutes as needed for chest pain.    Historical Provider, MD     Physical Exam: Filed Vitals:   11/01/15 1500 11/01/15 1530 11/01/15 1703 11/01/15 1813  BP: 126/64 127/85 129/68 145/55  Pulse: 87 97 90 80  Temp:   98.3 F (36.8 C) 98.7 F (37.1 C)  TempSrc:   Oral Oral  Resp: 18 24 20 20   Height:    5\' 4"  (1.626 m)  Weight:    73.936 kg (163 lb)  SpO2: 97% 98% 98% 99%     Constitutional: Vital signs reviewed. Patient is a well-developed and well-nourished in no acute distress and cooperative with exam. Alert and oriented x3.  Head: Normocephalic and atraumatic  Ear: TM normal bilaterally  Mouth: no erythema or exudates, MMM  Eyes: PERRL, EOMI, conjunctivae normal, No scleral icterus.  Neck: Supple, Trachea midline normal ROM, No JVD, mass, thyromegaly, or carotid bruit present.  Cardiovascular: RRR, S1 normal, S2 normal, no MRG, pulses symmetric and intact bilaterally  Slightly tachycardic.  Pulmonary/Chest: Effort normal. She has wheezes. Abdominal: Soft. Non-tender,  non-distended, bowel sounds are normal, no masses, organomegaly, or guarding present.  GU: no CVA tenderness Musculoskeletal: No joint deformities, erythema, or stiffness, ROM full and no nontender Ext: no edema and no cyanosis, pulses palpable bilaterally (DP and PT)  Hematology: no cervical, inginal, or axillary adenopathy.  Neurological: A&O x3,  Strenght is normal and symmetric bilaterally, cranial nerve II-XII are grossly intact, no focal motor deficit, sensory intact to light touch bilaterally.  Skin: Warm, dry and intact. No rash, cyanosis, or clubbing.  Psychiatric: Normal mood and affect. speech and behavior is normal. Judgment and thought content normal. Cognition and memory are normal.      Data Review   Micro Results No results found for this or any previous visit (from the past 240 hour(s)).  Radiology Reports Dg Chest Port 1 View  11/01/2015  CLINICAL DATA:  Shortness of breath. EXAM: PORTABLE CHEST 1 VIEW COMPARISON:  01/19/2015 FINDINGS: There is mild bilateral interstitial thickening. There is no focal parenchymal opacity. There is no pleural effusion or pneumothorax. The heart and mediastinal contours are unremarkable. There is evidence of prior right posterior rib fracture. There is prior anterior cervical fusion. IMPRESSION: No active disease. Electronically Signed   By: Kathreen Devoid   On: 11/01/2015 10:59     CBC  Recent Labs Lab 11/01/15 1105  WBC 9.4  HGB 11.9*  HCT 37.9  PLT 235  MCV 93.8  MCH 29.5  MCHC 31.4  RDW 13.1  LYMPHSABS 1.0  MONOABS 0.4  EOSABS 0.0  BASOSABS 0.1    Chemistries   Recent Labs Lab 11/01/15 1105  NA 143  K 3.2*  CL 95*  CO2 38*  GLUCOSE 133*  BUN 12  CREATININE 0.58  CALCIUM 9.4   ------------------------------------------------------------------------------------------------------------------ estimated creatinine clearance is 75.5 mL/min (by C-G formula based on Cr of  0.58). ------------------------------------------------------------------------------------------------------------------ No results for input(s): HGBA1C in the last 72 hours. ------------------------------------------------------------------------------------------------------------------ No results for input(s): CHOL, HDL, LDLCALC, TRIG, CHOLHDL, LDLDIRECT in the last 72 hours. ------------------------------------------------------------------------------------------------------------------ No results for input(s): TSH, T4TOTAL, T3FREE, THYROIDAB in the last 72 hours.  Invalid input(s): FREET3 ------------------------------------------------------------------------------------------------------------------ No results for input(s): VITAMINB12, FOLATE, FERRITIN, TIBC, IRON, RETICCTPCT in the last 72 hours.  Coagulation profile No results for input(s): INR, PROTIME in the last 168 hours.  No results for input(s): DDIMER in the last 72 hours.  Cardiac Enzymes No results for input(s): CKMB, TROPONINI, MYOGLOBIN in the last 168 hours.  Invalid input(s): CK ------------------------------------------------------------------------------------------------------------------ Invalid input(s): POCBNP   CBG: No results for input(s): GLUCAP in the last 168 hours.     EKG: Independently reviewed.    Assessment/Plan Principal Problem:    acuteCOPD (chronic obstructive pulmonary disease) (HCC) -no pneumonia, start empiric antibiotics, IV steroids, nebulizer treatments,  mucinex, pulmonary toilet, home nebulizer treatments  influenza PCR, droplet precautions, was scheduled to have CT by PCP , no abx prescribed by PCP     DM (diabetes mellitus) ([place]) past check hemoglobin A1c, SSI,     Obesity Body mass index is 27.97 kg/(m^2).    History of tobacco abuse- quit in 2013     hypokalemia-repeat, check magnesium level , repeat potassium    Code Status:   full Family Communication:  bedside Disposition Plan: admit   Total time spent 55 minutes.Greater than 50% of this time was spent in counseling, explanation of diagnosis, planning of further management, and coordination of care  [facility] Pager [phone]  If 7PM-7AM, please contact night-coverage [email or web address] Password [facility] 11/01/2015, 6:16 PM

## 2015-11-01 NOTE — ED Provider Notes (Addendum)
CSN: [id number]     Arrival date & time 11/01/15  1008 History   First MD Initiated Contact with Patient 11/01/15 1015     Chief Complaint  Patient presents with  . Shortness of Breath     (Consider location/radiation/quality/duration/timing/severity/associated sxs/prior Treatment) Patient is a 60 y.o. female presenting with shortness of breath. The history is provided by the patient and the EMS personnel.  Shortness of Breath Associated symptoms: wheezing   Associated symptoms: no abdominal pain, no chest pain, no fever, no headaches and no rash    patient with one-week history of shortness of breath. Did have a steroid injection a week ago with some improvement. Now getting worse with wheezing throughout. Patient has a history of COPD normally on 2 L of oxygen at all times. Patient normally followed at [facility] but they are on divert and EMS brought her here. Patient given albuterol Atrovent nebulizer on the way in by EMS.  Past Medical History  Diagnosis Date  . Anemia   . GERD (gastroesophageal reflux disease)   . COPD (chronic obstructive pulmonary disease) ([place])   . Barrett's esophagus   . Obesity   . Allergic rhinitis   . Gastritis   . Vitamin B12 deficiency   . Tobacco abuse   . HSV (herpes simplex virus) infection   . Mitral regurgitation     mild to moderate by echo 06/2013  . DDD (degenerative disc disease)   . Osteoporosis   . Chronic pain   . DJD (degenerative joint disease)   . Restless leg syndrome   . Anxiety   . Depression   . Insomnia   . Hypertension     hx of; PCP took pt off of BP meds  . Constipation due to pain medication   . Bruises easily   . Seasonal allergies   . Sleep apnea     does not wear machine d/t fiancial costs  . On home oxygen therapy     3 Liters nightly & PRN  . Coronary artery disease     normal coronaries by 03/2013 cath  . Anginal pain ([place])     with normal coronaries by cath 03/2013  . Shortness of breath     with  exertion; cannot lay flat  . Asthma   . Pneumonia     hx  . Chronic kidney disease     stage 4 kidney disease? does not see kidney doc  . Headache   . Neuromuscular disorder [facility])    Past Surgical History  Procedure Laterality Date  . Tonsillectomy    . Back surgery      C5-C6 anterior discectomy with fusion  . Carpal tunnel release    . Cystoscopy vaginoscopy w/ vaginal dilation    . Eye surgery      cataract extraction-bilateral  . Esophagogastroduodenoscopy endoscopy    . Tee without cardioversion N/A 07/19/2013    Procedure: TRANSESOPHAGEAL ECHOCARDIOGRAM (TEE);  Surgeon: Sanda Klein, MD;  Location: [facility] ENDOSCOPY;  Service: Cardiovascular;  Laterality: N/A;  . Cardiac catheterization  2014    2009  . Abdominal hysterectomy    . Colonoscopy    . Cholecystectomy    . Ankle surgery Left   . Anterior cervical decomp/discectomy fusion N/A 09/28/2013    Procedure: CERVICAL FOUR-FIVE, CERVICAL SIX-SEVEN ANTERIOR CERVICAL DECOMPRESSION/DISCECTOMY FUSION 2 LEVELS;  Surgeon: Faythe Ghee, MD;  Location: [facility] NEURO ORS;  Service: Neurosurgery;  Laterality: N/A;  . Craniotomy Right 09/08/2014  Procedure: Right Frontal Craniotomy w/Curve;  Surgeon: Faythe Ghee, MD;  Location: [facility] NEURO ORS;  Service: Neurosurgery;  Laterality: Right;  Right Frontal Craniotomy w/Curve  . Left heart catheterization with coronary angiogram N/A 03/29/2013    Procedure: LEFT HEART CATHETERIZATION WITH CORONARY ANGIOGRAM;  Surgeon: Lorretta Harp, MD;  Location: [facility] CATH LAB;  Service: Cardiovascular;  Laterality: N/A;   Family History  Problem Relation Age of Onset  . Coronary artery disease Mother 41    MI  . Coronary artery disease Father     MI  . Coronary artery disease Sister 87    MI  . Heart failure Father    Social History  Substance Use Topics  . Smoking status: Former Smoker -- 40 years    Types: Cigarettes  . Smokeless tobacco: Former Systems developer    Quit date: 03/16/2012  . Alcohol Use:  Yes     Comment: occasional   OB History    No data available     Review of Systems  Constitutional: Negative for fever.  HENT: Negative for congestion.   Eyes: Negative for redness.  Respiratory: Positive for shortness of breath and wheezing.   Cardiovascular: Negative for chest pain.  Gastrointestinal: Negative for abdominal pain.  Genitourinary: Negative for dysuria.  Musculoskeletal: Negative for back pain.  Skin: Negative for rash.  Neurological: Negative for headaches.  Hematological: Does not bruise/bleed easily.  Psychiatric/Behavioral: Negative for confusion.      Allergies  Aspirin; Avelox; Okra; Sulfa antibiotics; and Avelox  Home Medications   Prior to Admission medications   Medication Sig Start Date End Date Taking? Authorizing Provider  albuterol (PROVENTIL HFA;VENTOLIN HFA) 108 (90 BASE) MCG/ACT inhaler Inhale 2 puffs into the lungs every 6 (six) hours as needed for wheezing or shortness of breath.   Yes Historical Provider, MD  albuterol (PROVENTIL) (2.5 MG/3ML) 0.083% nebulizer solution Take 2.5 mg by nebulization every 6 (six) hours as needed for wheezing or shortness of breath.   Yes Historical Provider, MD  ALPRAZolam Duanne Moron) 1 MG tablet Take 1 mg by mouth 4 (four) times daily as needed for anxiety.   Yes Historical Provider, MD  budesonide-formoterol (SYMBICORT) 160-4.5 MCG/ACT inhaler Inhale 2 puffs into the lungs 2 (two) times daily.   Yes Historical Provider, MD  busPIRone (BUSPAR) 15 MG tablet Take 15 mg by mouth 3 (three) times daily. 2 tablets in am and 1 tablet afternoon and bedtime   Yes Historical Provider, MD  Cholecalciferol (VITAMIN D PO) Take 1 tablet by mouth daily.   Yes Historical Provider, MD  cyclobenzaprine (FLEXERIL) 10 MG tablet Take 10 mg by mouth at bedtime.   Yes Historical Provider, MD  dexlansoprazole (DEXILANT) 60 MG capsule Take 60 mg by mouth 2 (two) times daily.   Yes Historical Provider, MD  docusate sodium 100 MG CAPS Take  100 mg by mouth 2 (two) times daily. 09/10/14  Yes Newman Pies, MD  fluticasone [facility]) 50 MCG/ACT nasal spray Place 1 spray into both nostrils 2 (two) times daily.   Yes Historical Provider, MD  gabapentin (NEURONTIN) 400 MG capsule Take 400 mg by mouth at bedtime.   Yes Historical Provider, MD  Linaclotide Rolan Lipa) 145 MCG CAPS capsule Take 145 mcg by mouth daily.   Yes Historical Provider, MD  loratadine (CLARITIN) 10 MG tablet Take 10-20 mg by mouth daily.   Yes Historical Provider, MD  montelukast (SINGULAIR) 10 MG tablet Take 10 mg by mouth every morning.   Yes Historical Provider, MD  oxyCODONE (ROXICODONE) 15 MG immediate release tablet Take 15 mg by mouth every 6 (six) hours as needed for pain.   Yes Historical Provider, MD  Oxycodone HCl 10 MG TABS Take 10 mg by mouth 4 (four) times daily as needed (for break through pain).   Yes Historical Provider, MD  pramipexole (MIRAPEX) 1 MG tablet Take 1 mg by mouth at bedtime.   Yes Historical Provider, MD  promethazine (PHENERGAN) 25 MG tablet Take 25 mg by mouth every 6 (six) hours as needed for nausea or vomiting.   Yes Historical Provider, MD  simvastatin (ZOCOR) 20 MG tablet Take 20 mg by mouth every evening.   Yes Historical Provider, MD  tiotropium (SPIRIVA) 18 MCG inhalation capsule Place 18 mcg into inhaler and inhale daily.   Yes Historical Provider, MD  traZODone (DESYREL) 50 MG tablet Take 100-150 mg by mouth at bedtime.    Yes Historical Provider, MD  varenicline (CHANTIX) 1 MG tablet Take 1 mg by mouth 2 (two) times daily.   Yes Historical Provider, MD  Cyanocobalamin (VITAMIN B-12 PO) Take 1 tablet by mouth daily.    Historical Provider, MD  CYANOCOBALAMIN PO Take 1 tablet by mouth daily.    Historical Provider, MD  levETIRAcetam (KEPPRA) 500 MG tablet Take 1 tablet (500 mg total) by mouth 2 (two) times daily. 09/10/14   Newman Pies, MD  nitroGLYCERIN (NITROSTAT) 0.4 MG SL tablet Place 0.4 mg under the tongue every 5  (five) minutes as needed for chest pain.    Historical Provider, MD   BP 141/73 mmHg  Pulse 88  Temp(Src) 98.6 F (37 C) (Oral)  Resp 16  Ht 5\' 4"  (1.626 m)  Wt 73.936 kg  BMI 27.97 kg/m2  SpO2 96% Physical Exam  Constitutional: She is oriented to person, place, and time. She appears well-developed and well-nourished. No distress.  HENT:  Head: Normocephalic and atraumatic.  Mouth/Throat: Oropharynx is clear and moist.  Eyes: Conjunctivae are normal.  Neck: Normal range of motion.  Cardiovascular: Regular rhythm and normal heart sounds.   Slightly tachycardic.  Pulmonary/Chest: Effort normal. She has wheezes.  Abdominal: Soft. Bowel sounds are normal. There is no tenderness.  Neurological: She is alert and oriented to person, place, and time. No cranial nerve deficit. She exhibits normal muscle tone. Coordination normal.  Skin: Skin is warm. No rash noted.  Nursing note and vitals reviewed.   ED Course  Procedures (including critical care time) Labs Review Labs Reviewed  BASIC METABOLIC PANEL - Abnormal; Notable for the following:    Potassium 3.2 (*)    Chloride 95 (*)    CO2 38 (*)    Glucose, Bld 133 (*)    All other components within normal limits  CBC WITH DIFFERENTIAL/PLATELET - Abnormal; Notable for the following:    Hemoglobin 11.9 (*)    Neutro Abs 7.9 (*)    All other components within normal limits   Results for orders placed or performed during the hospital encounter of [id number]  Basic metabolic panel  Result Value Ref Range   Sodium 143 135 - 145 mmol/L   Potassium 3.2 (L) 3.5 - 5.1 mmol/L   Chloride 95 (L) 101 - 111 mmol/L   CO2 38 (H) 22 - 32 mmol/L   Glucose, Bld 133 (H) 65 - 99 mg/dL   BUN 12 6 - 20 mg/dL   Creatinine, Ser 0.58 0.44 - 1.00 mg/dL   Calcium 9.4 8.9 - 10.3 mg/dL   GFR calc non Af  Amer >60 >60 mL/min   GFR calc Af Amer >60 >60 mL/min   Anion gap 10 5 - 15  CBC with Differential/Platelet  Result Value Ref Range   WBC 9.4 4.0 -  10.5 K/uL   RBC 4.04 3.87 - 5.11 MIL/uL   Hemoglobin 11.9 (L) 12.0 - 15.0 g/dL   HCT 37.9 36.0 - 46.0 %   MCV 93.8 78.0 - 100.0 fL   MCH 29.5 26.0 - 34.0 pg   MCHC 31.4 30.0 - 36.0 g/dL   RDW 13.1 11.5 - 15.5 %   Platelets 235 150 - 400 K/uL   Neutrophils Relative % 85 %   Neutro Abs 7.9 (H) 1.7 - 7.7 K/uL   Lymphocytes Relative 10 %   Lymphs Abs 1.0 0.7 - 4.0 K/uL   Monocytes Relative 4 %   Monocytes Absolute 0.4 0.1 - 1.0 K/uL   Eosinophils Relative 0 %   Eosinophils Absolute 0.0 0.0 - 0.7 K/uL   Basophils Relative 1 %   Basophils Absolute 0.1 0.0 - 0.1 K/uL     Imaging Review Dg Chest Port 1 View  11/01/2015  CLINICAL DATA:  Shortness of breath. EXAM: PORTABLE CHEST 1 VIEW COMPARISON:  01/19/2015 FINDINGS: There is mild bilateral interstitial thickening. There is no focal parenchymal opacity. There is no pleural effusion or pneumothorax. The heart and mediastinal contours are unremarkable. There is evidence of prior right posterior rib fracture. There is prior anterior cervical fusion. IMPRESSION: No active disease. Electronically Signed   By: Kathreen Devoid   On: 11/01/2015 10:59   I have personally reviewed and evaluated these images and lab results as part of my medical decision-making.   EKG Interpretation   Date/Time:  Wednesday November 01 2015 14:07:09 EST Ventricular Rate:  93 PR Interval:  151 QRS Duration: 102 QT Interval:  392 QTC Calculation: 488 R Axis:   67 Text Interpretation:  Sinus rhythm Borderline prolonged QT interval  Confirmed by Rickardo Brinegar  MD, Azusena Erlandson ([id number]) on 11/01/2015 3:06:53 PM      MDM   Final diagnoses:  COPD exacerbation ([place])    Patient normally on 2 L of oxygen. Oxygen saturations are fine on her normal amount of oxygen. Patient's had a one-week history of shortness of breath and wheezing. One week ago saw her primary care doctor has steroid injection with some improvement but now getting worse. Patient given albuterol Atrovent by EMS on  the way in. Patient with a repeat albuterol Atrovent here then followed up with just albuterol. Patient's wheezing is now getting worse. Although overall better than when she first arrived. Chest x-ray negative for pneumonia pneumothorax or pulmonary edema. Patient also given Solu-Medrol 125 mg. Patient in no acute distress but still with persistent bilateral wheezing will require admission.    Fredia Sorrow, MD 11/01/15 [place], MD 11/01/15 1507

## 2015-11-01 NOTE — ED Notes (Signed)
MD at bedside. 

## 2015-11-02 DIAGNOSIS — J441 Chronic obstructive pulmonary disease with (acute) exacerbation: Secondary | ICD-10-CM

## 2015-11-02 DIAGNOSIS — Z87891 Personal history of nicotine dependence: Secondary | ICD-10-CM

## 2015-11-02 DIAGNOSIS — J9621 Acute and chronic respiratory failure with hypoxia: Secondary | ICD-10-CM

## 2015-11-02 DIAGNOSIS — J189 Pneumonia, unspecified organism: Secondary | ICD-10-CM

## 2015-11-02 LAB — COMPREHENSIVE METABOLIC PANEL
ALBUMIN: 3.2 g/dL — AB (ref 3.5–5.0)
ALK PHOS: 58 U/L (ref 38–126)
ALT: 16 U/L (ref 14–54)
ANION GAP: 5 (ref 5–15)
AST: 17 U/L (ref 15–41)
BILIRUBIN TOTAL: 0.4 mg/dL (ref 0.3–1.2)
BUN: 17 mg/dL (ref 6–20)
CALCIUM: 9.3 mg/dL (ref 8.9–10.3)
CO2: 34 mmol/L — ABNORMAL HIGH (ref 22–32)
Chloride: 101 mmol/L (ref 101–111)
Creatinine, Ser: 0.59 mg/dL (ref 0.44–1.00)
GFR calc non Af Amer: >60 mL/min (ref >60)
GLUCOSE: 161 mg/dL — AB (ref 65–99)
POTASSIUM: 4.1 mmol/L (ref 3.5–5.1)
SODIUM: 140 mmol/L (ref 135–145)
TOTAL PROTEIN: 6.1 g/dL — AB (ref 6.5–8.1)

## 2015-11-02 LAB — CBC
HEMATOCRIT: 33.3 % — AB (ref 36.0–46.0)
HEMOGLOBIN: 10.7 g/dL — AB (ref 12.0–15.0)
MCH: 29.9 pg (ref 26.0–34.0)
MCHC: 32.1 g/dL (ref 30.0–36.0)
MCV: 93 fL (ref 78.0–100.0)
Platelets: 201 10*3/uL (ref 150–400)
RBC: 3.58 MIL/uL — AB (ref 3.87–5.11)
RDW: 13.5 % (ref 11.5–15.5)
WBC: 6.7 10*3/uL (ref 4.0–10.5)

## 2015-11-02 LAB — GLUCOSE, CAPILLARY
GLUCOSE-CAPILLARY: 186 mg/dL — AB (ref 65–99)
Glucose-Capillary: 116 mg/dL — ABNORMAL HIGH (ref 65–99)
Glucose-Capillary: 101 mg/dL — ABNORMAL HIGH (ref 65–99)
Glucose-Capillary: 120 mg/dL — ABNORMAL HIGH (ref 65–99)

## 2015-11-02 LAB — INFLUENZA PANEL BY PCR (TYPE A & B)
H1N1FLUPCR: NOT DETECTED
INFLAPCR: NEGATIVE
INFLBPCR: NEGATIVE

## 2015-11-02 LAB — HEMOGLOBIN A1C
Hgb A1c MFr Bld: 6 % — ABNORMAL HIGH (ref 4.8–5.6)
Mean Plasma Glucose: 126 mg/dL

## 2015-11-02 LAB — TROPONIN I: Troponin I: <0.03 ng/mL (ref <0.031)

## 2015-11-02 LAB — TSH: TSH: 0.181 u[IU]/mL — AB (ref 0.350–4.500)

## 2015-11-02 LAB — MAGNESIUM: MAGNESIUM: 2.1 mg/dL (ref 1.7–2.4)

## 2015-11-02 MED ORDER — WHITE PETROLATUM GEL
Status: AC
  Administered 2015-11-02: 0.2
  Filled 2015-11-02: qty 1

## 2015-11-02 MED ORDER — LEVALBUTEROL HCL 0.63 MG/3ML IN NEBU
0.6300 mg | INHALATION_SOLUTION | Freq: Four times a day (QID) | RESPIRATORY_TRACT | Status: DC | PRN
  Administered 2015-11-02 – 2015-11-03 (×2): 0.63 mg via RESPIRATORY_TRACT
  Filled 2015-11-02 (×2): qty 3

## 2015-11-02 MED ORDER — ALBUTEROL SULFATE (2.5 MG/3ML) 0.083% IN NEBU: 2.5000 mg | INHALATION_SOLUTION | RESPIRATORY_TRACT | Status: DC | PRN

## 2015-11-02 MED ORDER — GUAIFENESIN ER 600 MG PO TB12
1200.0000 mg | ORAL_TABLET | Freq: Two times a day (BID) | ORAL | Status: DC
  Administered 2015-11-02 – 2015-11-05 (×7): 1200 mg via ORAL
  Filled 2015-11-02 (×8): qty 2

## 2015-11-02 NOTE — Progress Notes (Addendum)
PROGRESS NOTE    Shannon Morse [id number] DOB: 01-Aug-1956 DOA: 11/01/2015 PCP: Lorelei Pont, MD  HPI/Brief narrative 60 year old female patient with history of COPD, chronic respiratory failure on home oxygen 2 L/m, GERD, tobacco abuse, mitral regurgitation, chronic pain, HTN, anxiety & depression, OSA-not on CPAP, normal coronary by cath 03/2013, stage IV chronic kidney disease, presented to [facility] on 11/01/15 with one-week history of progressive dyspnea. Received a dose of steroid injection at the PCPs office 1 week ago without significant improvement. EKG showed sinus tachycardia. Chest x-ray without acute findings. Transferred to [facility] for COPD exacerbation management.   Assessment/Plan:   COPD exacerbation precipitated by PNA - Treating with oxygen, bronchodilator nebulizations, IV Solu-Medrol, empiric antibiotics. - Clinically improving. Continue management. - Add flutter valve.  Community Acquired PNA - Neg CXR. CTA chest showed no PE but pulmonary nodules suggestive of infectious process - Continue IV Ceftriaxone & Azithromycin  Acute on chronic respiratory failure with hypoxia - Precipitated by pneumonia and COPD exacerbation. Treat same and wean oxygen as tolerated to home level.  Anemia - stable  DM 2 - continue SSI. Reasonable in patient control  Tobacco Abuse - quit 2013  Hypokalemia - replaced > 4. Mg 2.1  NSVT - noted on telemetry- continue - asymptomatic - K and Mg OK. Change PRN nebs to Xopenex. Check Echo for LV fxn. Likely pptd by resp status. - EKG 11/02/15 personally reviewed: ST at 101 bpm, nonspecific ST-T changes and QTC 459 ms.  Seizure disorder - Continue Keppra  HLD - Tinea statins    DVT prophylaxis: Lovenox Code Status: Full Family Communication: None at bedside Disposition Plan: DC home when medically stable   Consultants:  None  Procedures:  None  Antimicrobials:  IV Rocephin 1/11  >  Azithromycin 1/11 >   Subjective: Feels slightly better. Dyspnea improved. Cough with intermittent yellow sputum. No chest pain.  Objective: Filed Vitals:   11/01/15 1945 11/01/15 2031 11/02/15 0632 11/02/15 1415  BP:  150/68 144/70 110/55  Pulse:  101 118 109  Temp:  98.4 F (36.9 C) 98.4 F (36.9 C) 98.5 F (36.9 C)  TempSrc:  Oral Oral Oral  Resp:  24  22  Height:      Weight:      SpO2: 98% 96% 95% 96%    Intake/Output Summary (Last 24 hours) at 11/02/15 1658 Last data filed at 11/02/15 1423  Gross per 24 hour  Intake 2571.67 ml  Output    450 ml  Net 2121.67 ml   Filed Weights   11/01/15 1014 11/01/15 1813  Weight: 73.936 kg (163 lb) 73.936 kg (163 lb)    Exam:  General exam: Pleasant middle-aged female lying comfortably propped up in bed. Respiratory system: Diminished breath sounds bilaterally with scattered occasional expiratory rhonchi and no crackles. No increased work of breathing. Cardiovascular system: S1 & S2 heard, RRR. No JVD, murmurs, gallops, clicks or pedal edema. Telemetry: Sinus tachycardia in the 100s. 5 beat NSVT on 1/11 at 8:42 PM. Gastrointestinal system: Abdomen is nondistended, soft and nontender. Normal bowel sounds heard. Central nervous system: Alert and oriented. No focal neurological deficits. Extremities: Symmetric 5 x 5 power.   Data Reviewed: Basic Metabolic Panel:  Recent Labs Lab 11/01/15 1105 11/01/15 1839 11/02/15 0459 11/02/15 1224  NA 143  --  140  --   K 3.2*  --  4.1  --   CL 95*  --  101  --   CO2 38*  --  34*  --   GLUCOSE 133*  --  161*  --   BUN 12  --  17  --   CREATININE 0.58 0.62 0.59  --   CALCIUM 9.4  --  9.3  --   MG  --  2.0  --  2.1   Liver Function Tests:  Recent Labs Lab 11/02/15 0459  AST 17  ALT 16  ALKPHOS 58  BILITOT 0.4  PROT 6.1*  ALBUMIN 3.2*   No results for input(s): LIPASE, AMYLASE in the last 168 hours. No results for input(s): AMMONIA in the last 168  hours. CBC:  Recent Labs Lab 11/01/15 1105 11/01/15 1839 11/02/15 0459  WBC 9.4 7.3 6.7  NEUTROABS 7.9*  --   --   HGB 11.9* 11.3* 10.7*  HCT 37.9 36.5 33.3*  MCV 93.8 92.6 93.0  PLT 235 234 201   Cardiac Enzymes:  Recent Labs Lab 11/01/15 1839 11/02/15 0027 11/02/15 0459  TROPONINI <0.03 <0.03 <0.03   BNP (last 3 results) No results for input(s): PROBNP in the last 8760 hours. CBG:  Recent Labs Lab 11/01/15 2111 11/02/15 0817 11/02/15 1301 11/02/15 1646  GLUCAP 229* 116* 101* 120*    No results found for this or any previous visit (from the past 240 hour(s)).       Studies: Ct Angio Chest Pe W/cm &/or Wo Cm  11/01/2015  CLINICAL DATA:  60 year old female with shortness of breath. EXAM: CT ANGIOGRAPHY CHEST WITH CONTRAST TECHNIQUE: Multidetector CT imaging of the chest was performed using the standard protocol during bolus administration of intravenous contrast. Multiplanar CT image reconstructions and MIPs were obtained to evaluate the vascular anatomy. CONTRAST:  174mL OMNIPAQUE IOHEXOL 350 MG/ML SOLN COMPARISON:  Chest radiograph dated 11/01/2015 FINDINGS: There is mild emphysematous changes of the lungs. There are innumerable small nodular densities predominantly involving the right upper and right lower low most compatible with an infectious process and likely an atypical pneumonia. There is no focal consolidation, pleural effusion, or pneumothorax. The central airways are patent. The thoracic aorta appears unremarkable. No CT evidence of pulmonary embolism. There is no hilar or mediastinal adenopathy. No cardiomegaly or pericardial effusion. The esophagus is grossly unremarkable. No thyroid nodule identified. There is no axillary adenopathy the chest wall soft tissues appear unremarkable. There is degenerative changes of the spine. Partially visualized anterior cervical fixation plate and screws. No acute fracture. The visualized upper abdomen appears unremarkable.  Review of the MIP images confirms the above findings. IMPRESSION: No CT evidence of pulmonary embolism. Innumerable small pulmonary nodules most compatible with pneumonia. Clinical correlation and follow-up recommended. Electronically Signed   By: Anner Crete M.D.   On: 11/01/2015 23:23   Dg Chest Port 1 View  11/01/2015  CLINICAL DATA:  Shortness of breath. EXAM: PORTABLE CHEST 1 VIEW COMPARISON:  01/19/2015 FINDINGS: There is mild bilateral interstitial thickening. There is no focal parenchymal opacity. There is no pleural effusion or pneumothorax. The heart and mediastinal contours are unremarkable. There is evidence of prior right posterior rib fracture. There is prior anterior cervical fusion. IMPRESSION: No active disease. Electronically Signed   By: Kathreen Devoid   On: 11/01/2015 10:59        Scheduled Meds: . azithromycin  500 mg Intravenous Q24H  . budesonide-formoterol  2 puff Inhalation BID  . busPIRone  15 mg Oral TID  . cefTRIAXone (ROCEPHIN)  IV  1 g Intravenous Q24H  . cyclobenzaprine  10 mg Oral QHS  . docusate sodium  100 mg Oral BID  . enoxaparin (LOVENOX) injection  40 mg Subcutaneous Q24H  . fluticasone  1 spray Each Nare BID  . gabapentin  400 mg Oral QHS  . guaiFENesin  1,200 mg Oral BID  . insulin aspart  0-9 Units Subcutaneous TID WC  . ipratropium-albuterol  3 mL Nebulization Q6H  . levETIRAcetam  500 mg Oral BID  . Linaclotide  145 mcg Oral Daily  . methylPREDNISolone (SOLU-MEDROL) injection  60 mg Intravenous Q6H  . montelukast  10 mg Oral BH-q7a  . pantoprazole  40 mg Oral Daily  . pramipexole  1 mg Oral QHS  . simvastatin  20 mg Oral QPM  . sodium chloride  3 mL Intravenous Q12H  . traZODone  200 mg Oral QHS   Continuous Infusions:    Principal Problem:   COPD (chronic obstructive pulmonary disease) (H[facility]) Active Problems:   DM (diabetes mellitus) ([place])   Obesity   History of tobacco abuse   COPD exacerbation (H[facility])   Acute exacerbation of  COPD with asthma ([place])    Time spent: 35 minutes.    Vernell Leep, MD, FACP, FHM. [facility] Pager [phone]  If 7PM-7AM, please contact night-coverage [email or web address] Password [email or web address] 11/02/2015, 4:58 PM    LOS: 1 day

## 2015-11-02 NOTE — Care Management Note (Signed)
Case Management Note  Patient Details  Name: Shannon Morse MRN: [id number] Date of Birth: [birth year]  Subjective/Objective:                    Action/Plan:  Initial UR completed .Expected Discharge Date:                  Expected Discharge Plan:  Home/Self Care  In-House Referral:     Discharge planning Services     Post Acute Care Choice:    Choice offered to:     DME Arranged:    DME Agency:     HH Arranged:    HH Agency:     Status of Service:  In process, will continue to follow  Medicare Important Message Given:    Date Medicare IM Given:    Medicare IM give by:    Date Additional Medicare IM Given:    Additional Medicare Important Message give by:     If discussed at [place] of Stay Meetings, dates discussed:    Additional Comments:  Marilu Favre, RN 11/02/2015, 8:25 AM

## 2015-11-02 NOTE — Progress Notes (Signed)
Nutrition Brief Note  Patient identified on the Malnutrition Screening Tool (MST) Report  Wt Readings from Last 15 Encounters:  11/01/15 163 lb (73.936 kg)  12/03/14 175 lb (79.379 kg)  09/09/14 175 lb 0.7 oz (79.4 kg)  09/02/14 165 lb 12.6 oz (75.2 kg)  09/02/14 167 lb (75.751 kg)  09/24/13 196 lb 1 oz (88.933 kg)  07/19/13 215 lb (97.523 kg)  03/29/13 205 lb (92.12 kg)   60 year old female with a history of Prior cardiac catheterization with normal coronaries, COPD, mitral insufficiency, allergic rhinitis, gastroesophageal reflux disease who presents to the ER [facility] to [facility] , with a history of one week shortness of breath progressively getting worse. She received steroid injection at her PCP office one week ago and has progressively gotten worse. Symptoms relieved with nebulizer treatment in the ER. Present history of COPD on 2 L of oxygen at all times during the day. EKG consistent with sinus tachycardia. Chest x-ray negative for pneumonia, pneumothorax, pulmonary edema. Patient received IV Solu-Medrol nebulizer treatments and transferred to [facility] for further assessment.  Pt asleep in fetal position with blankets covering the majority of her body.   Spoke with RN, who reports that pt has had a good appetite and eating well. Noted 70-85% meal completion.   Pt consumed approximately 50% of her Ensure Enlive supplement at bedside.   Reviewed wt hx, which revealed hx of distant wt loss, however, no weight changes within the past year have been significant.   Body mass index is 27.97 kg/(m^2). Patient meets criteria for overweight based on current BMI.   Current diet order is Carb Modified, patient is consuming approximately 70-85% of meals at this time. Labs and medications reviewed.   No nutrition interventions warranted at this time. If nutrition issues arise, please consult RD.   Channelle Bottger A. Jimmye Norman, RD, LDN, CDE Pager: [phone] After hours Pager: [phone]

## 2015-11-03 ENCOUNTER — Inpatient Hospital Stay (HOSPITAL_COMMUNITY): Payer: Medicare Other

## 2015-11-03 DIAGNOSIS — R000 Tachycardia, unspecified: Secondary | ICD-10-CM

## 2015-11-03 DIAGNOSIS — I10 Essential (primary) hypertension: Secondary | ICD-10-CM

## 2015-11-03 LAB — GLUCOSE, CAPILLARY
GLUCOSE-CAPILLARY: 103 mg/dL — AB (ref 65–99)
GLUCOSE-CAPILLARY: 146 mg/dL — AB (ref 65–99)
GLUCOSE-CAPILLARY: 137 mg/dL — AB (ref 65–99)
Glucose-Capillary: 103 mg/dL — ABNORMAL HIGH (ref 65–99)

## 2015-11-03 MED ORDER — AZITHROMYCIN 250 MG PO TABS
500.0000 mg | ORAL_TABLET | Freq: Every day | ORAL | Status: DC
  Administered 2015-11-03 – 2015-11-04 (×2): 500 mg via ORAL
  Filled 2015-11-03 (×2): qty 2

## 2015-11-03 NOTE — Progress Notes (Signed)
  Echocardiogram 2D Echocardiogram has been performed.  Darlina Sicilian M 11/03/2015, 9:55 AM

## 2015-11-03 NOTE — Care Management Important Message (Signed)
Important Message  Patient Details  Name: Shannon Morse MRN: [id number] Date of Birth: 1956-06-22   Medicare Important Message Given:  Yes    [name] 11/03/2015, 3:27 PM

## 2015-11-03 NOTE — Progress Notes (Signed)
PROGRESS NOTE    Shannon Morse [id number] DOB: 1956-10-10 DOA: 11/01/2015 PCP: Lorelei Pont, MD  Primary Cardiologist: Dr. Claudie Leach at [facility] Primary Pulmonologist: Dr. Glade Lloyd  HPI/Brief narrative 60 year old female patient with history of COPD, chronic respiratory failure on home oxygen 2 L/m, GERD, tobacco abuse, mitral regurgitation, chronic pain, HTN, anxiety & depression, OSA-not on CPAP, normal coronary by cath 03/2013, stage IV chronic kidney disease, presented to the [facility] [place] [facility] on 11/01/15 with one-week history of progressive dyspnea. Received a dose of steroid injection at the PCPs office 1 week ago without significant improvement. EKG showed sinus tachycardia. Chest x-ray without acute findings. Transferred to [facility] for COPD exacerbation management.   Assessment/Plan:   COPD exacerbation precipitated by PNA - Treating with oxygen, bronchodilator nebulizations, IV Solu-Medrol, empiric antibiotics. - Clinically improving. Continue management. - Added flutter valve.  Community Acquired PNA - Neg CXR. CTA chest showed no PE but pulmonary nodules suggestive of infectious process - Continue IV Ceftriaxone & Azithromycin - We will need follow-up CT chest in 4-6 weeks to ensure resolution of above findings especially given prior history of tobacco abuse.  Acute on chronic respiratory failure with hypoxia - Precipitated by pneumonia and COPD exacerbation. Treat same and wean oxygen as tolerated to home level.  Anemia - stable  DM 2 - continue SSI. Reasonable in patient control  Tobacco Abuse - quit 2013  Hypokalemia - replaced > 4. Mg 2.1  NSVT - noted on telemetry- continue - asymptomatic - K and Mg OK. Change PRN nebs to Xopenex. Check Echo: Results as below. Likely pptd by resp status. - EKG 11/02/15 personally reviewed: ST at 101 bpm, nonspecific ST-T changes and QTC 459 ms. - No further episodes on monitor. - Patient states that  she follows with Dr. Claudie Leach, Cardiology at [facility] and is told to have "irregular heartbeats" and "leaking valve".  Seizure disorder - Continue Keppra  HLD - Continue statins    DVT prophylaxis: Lovenox Code Status: Full Family Communication: None at bedside Disposition Plan: DC home when medically stable   Consultants:  None  Procedures:  2-D echo 11/03/15: Study Conclusions  - Left ventricle: The cavity size was normal. Wall thickness was increased in a pattern of mild LVH. Systolic function was normal. The estimated ejection fraction was in the range of 60% to 65%. Wall motion was normal; there were no regional wall motion abnormalities. Doppler parameters are consistent with abnormal left ventricular relaxation (grade 1 diastolic dysfunction). The E/e&' ratio is >15, suggesting elevated LV filling pressure. - Mitral valve: Mildly thickened leaflets . There was mild to moderate regurgitation. - Left atrium: The atrium was normal in size. - Inferior vena cava: The vessel was normal in size. The respirophasic diameter changes were in the normal range (>= 50%), consistent with normal central venous pressure.  Impressions:  - Compared to a prior study in 2014, the EF is higher at 60-65%. The degree of MR is still mild to moderate.  Antimicrobials:  IV Rocephin 1/11 >  Azithromycin 1/11 >   Subjective: Dyspnea continues to improve. States that she coughs only when she takes a deep breath. No new complaints reported.  Objective: Filed Vitals:   11/03/15 0300 11/03/15 0753 11/03/15 1255 11/03/15 1435  BP: 116/63   156/67  Pulse: 102   110  Temp: 98.6 F (37 C)   98.2 F (36.8 C)  TempSrc: Oral   Oral  Resp: 19   19  Height:  Weight:      SpO2: 98% 93% 94% 96%    Intake/Output Summary (Last 24 hours) at 11/03/15 1808 Last data filed at 11/03/15 1700  Gross per 24 hour  Intake   2100 ml  Output   1700 ml  Net     400 ml   Filed Weights   11/01/15 1014 11/01/15 1813  Weight: 73.936 kg (163 lb) 73.936 kg (163 lb)    Exam:  General exam: Pleasant middle-aged female lying comfortably propped up in bed. Respiratory system: Improved breath sounds. Still slightly harsh with occasional expiratory rhonchi. No increased work of breathing. Cardiovascular system: S1 & S2 heard, RRR. No JVD, murmurs, gallops, clicks or pedal edema. Telemetry: SR. No further NSVTs. Gastrointestinal system: Abdomen is nondistended, soft and nontender. Normal bowel sounds heard. Central nervous system: Alert and oriented. No focal neurological deficits. Extremities: Symmetric 5 x 5 power.   Data Reviewed: Basic Metabolic Panel:  Recent Labs Lab 11/01/15 1105 11/01/15 1839 11/02/15 0459 11/02/15 1224  NA 143  --  140  --   K 3.2*  --  4.1  --   CL 95*  --  101  --   CO2 38*  --  34*  --   GLUCOSE 133*  --  161*  --   BUN 12  --  17  --   CREATININE 0.58 0.62 0.59  --   CALCIUM 9.4  --  9.3  --   MG  --  2.0  --  2.1   Liver Function Tests:  Recent Labs Lab 11/02/15 0459  AST 17  ALT 16  ALKPHOS 58  BILITOT 0.4  PROT 6.1*  ALBUMIN 3.2*   No results for input(s): LIPASE, AMYLASE in the last 168 hours. No results for input(s): AMMONIA in the last 168 hours. CBC:  Recent Labs Lab 11/01/15 1105 11/01/15 1839 11/02/15 0459  WBC 9.4 7.3 6.7  NEUTROABS 7.9*  --   --   HGB 11.9* 11.3* 10.7*  HCT 37.9 36.5 33.3*  MCV 93.8 92.6 93.0  PLT 235 234 201   Cardiac Enzymes:  Recent Labs Lab 11/01/15 1839 11/02/15 0027 11/02/15 0459  TROPONINI <0.03 <0.03 <0.03   BNP (last 3 results) No results for input(s): PROBNP in the last 8760 hours. CBG:  Recent Labs Lab 11/02/15 1646 11/02/15 2108 11/03/15 0821 11/03/15 1230 11/03/15 1704  GLUCAP 120* 186* 103* 103* 146*    No results found for this or any previous visit (from the past 240 hour(s)).       Studies: Ct Angio Chest Pe W/cm &/or  Wo Cm  11/01/2015  CLINICAL DATA:  60 year old female with shortness of breath. EXAM: CT ANGIOGRAPHY CHEST WITH CONTRAST TECHNIQUE: Multidetector CT imaging of the chest was performed using the standard protocol during bolus administration of intravenous contrast. Multiplanar CT image reconstructions and MIPs were obtained to evaluate the vascular anatomy. CONTRAST:  135mL OMNIPAQUE IOHEXOL 350 MG/ML SOLN COMPARISON:  Chest radiograph dated 11/01/2015 FINDINGS: There is mild emphysematous changes of the lungs. There are innumerable small nodular densities predominantly involving the right upper and right lower low most compatible with an infectious process and likely an atypical pneumonia. There is no focal consolidation, pleural effusion, or pneumothorax. The central airways are patent. The thoracic aorta appears unremarkable. No CT evidence of pulmonary embolism. There is no hilar or mediastinal adenopathy. No cardiomegaly or pericardial effusion. The esophagus is grossly unremarkable. No thyroid nodule identified. There is no axillary adenopathy the chest  wall soft tissues appear unremarkable. There is degenerative changes of the spine. Partially visualized anterior cervical fixation plate and screws. No acute fracture. The visualized upper abdomen appears unremarkable. Review of the MIP images confirms the above findings. IMPRESSION: No CT evidence of pulmonary embolism. Innumerable small pulmonary nodules most compatible with pneumonia. Clinical correlation and follow-up recommended. Electronically Signed   By: Anner Crete M.D.   On: 11/01/2015 23:23        Scheduled Meds: . azithromycin  500 mg Oral q1800  . budesonide-formoterol  2 puff Inhalation BID  . busPIRone  15 mg Oral TID  . cefTRIAXone (ROCEPHIN)  IV  1 g Intravenous Q24H  . cyclobenzaprine  10 mg Oral QHS  . docusate sodium  100 mg Oral BID  . enoxaparin (LOVENOX) injection  40 mg Subcutaneous Q24H  . fluticasone  1 spray Each  Nare BID  . gabapentin  400 mg Oral QHS  . guaiFENesin  1,200 mg Oral BID  . insulin aspart  0-9 Units Subcutaneous TID WC  . ipratropium-albuterol  3 mL Nebulization Q6H  . levETIRAcetam  500 mg Oral BID  . Linaclotide  145 mcg Oral Daily  . methylPREDNISolone (SOLU-MEDROL) injection  60 mg Intravenous Q6H  . montelukast  10 mg Oral BH-q7a  . pantoprazole  40 mg Oral Daily  . pramipexole  1 mg Oral QHS  . simvastatin  20 mg Oral QPM  . sodium chloride  3 mL Intravenous Q12H  . traZODone  200 mg Oral QHS   Continuous Infusions:    Principal Problem:   COPD (chronic obstructive pulmonary disease) (HCC) Active Problems:   DM (diabetes mellitus) ([place])   Obesity   History of tobacco abuse   COPD exacerbation (HCC)   Acute exacerbation of COPD with asthma ([place])    Time spent: 15 minutes.    Vernell Leep, MD, FACP, FHM. [facility] Pager [phone]  If 7PM-7AM, please contact night-coverage [email or web address] Password [email or web address] 11/03/2015, 6:08 PM    LOS: 2 days

## 2015-11-03 NOTE — Progress Notes (Signed)
Service response called this RN to inform that patient called her to order pork sausage but Service response replied to patient that she cannot have pork sausage but instead [place] sausage.  During that time, patient shouted at Service Response due to denial of his request for pork sausage.  This RN approach patient and talked about the incident and patient stated, "I do not want to talk about it now, let me calm down".  Patient then asked for saltine crackers and tissue.

## 2015-11-04 LAB — TSH: TSH: 0.413 u[IU]/mL (ref 0.350–4.500)

## 2015-11-04 LAB — GLUCOSE, CAPILLARY
GLUCOSE-CAPILLARY: 174 mg/dL — AB (ref 65–99)
GLUCOSE-CAPILLARY: 114 mg/dL — AB (ref 65–99)
Glucose-Capillary: 112 mg/dL — ABNORMAL HIGH (ref 65–99)
Glucose-Capillary: 122 mg/dL — ABNORMAL HIGH (ref 65–99)

## 2015-11-04 LAB — T4, FREE: FREE T4: 0.82 ng/dL (ref 0.61–1.12)

## 2015-11-04 MED ORDER — OXYCODONE HCL 5 MG PO TABS
15.0000 mg | ORAL_TABLET | Freq: Three times a day (TID) | ORAL | Status: DC | PRN
  Administered 2015-11-05: 15 mg via ORAL
  Filled 2015-11-04: qty 3

## 2015-11-04 MED ORDER — PANTOPRAZOLE SODIUM 40 MG PO TBEC
40.0000 mg | DELAYED_RELEASE_TABLET | Freq: Two times a day (BID) | ORAL | Status: DC
  Administered 2015-11-04 – 2015-11-05 (×3): 40 mg via ORAL
  Filled 2015-11-04 (×2): qty 1

## 2015-11-04 MED ORDER — ASPIRIN EC 81 MG PO TBEC
81.0000 mg | DELAYED_RELEASE_TABLET | ORAL | Status: DC
  Administered 2015-11-04: 81 mg via ORAL
  Filled 2015-11-04: qty 1

## 2015-11-04 MED ORDER — OXYCODONE HCL ER 15 MG PO T12A
40.0000 mg | EXTENDED_RELEASE_TABLET | Freq: Two times a day (BID) | ORAL | Status: DC
  Administered 2015-11-04 – 2015-11-05 (×3): 40 mg via ORAL
  Filled 2015-11-04 (×3): qty 1

## 2015-11-04 MED ORDER — OXYCODONE HCL 5 MG PO TABS: 5.0000 mg | ORAL_TABLET | Freq: Four times a day (QID) | ORAL | Status: DC | PRN

## 2015-11-04 MED ORDER — LEVALBUTEROL HCL 0.63 MG/3ML IN NEBU
0.6300 mg | INHALATION_SOLUTION | RESPIRATORY_TRACT | Status: DC | PRN
  Administered 2015-11-05: 0.63 mg via RESPIRATORY_TRACT
  Filled 2015-11-04: qty 3

## 2015-11-04 MED ORDER — VARENICLINE TARTRATE 1 MG PO TABS
1.0000 mg | ORAL_TABLET | Freq: Two times a day (BID) | ORAL | Status: DC
  Administered 2015-11-04 – 2015-11-05 (×3): 1 mg via ORAL
  Filled 2015-11-04 (×5): qty 1

## 2015-11-04 MED ORDER — LORATADINE 10 MG PO TABS
10.0000 mg | ORAL_TABLET | Freq: Every day | ORAL | Status: DC
  Administered 2015-11-04 – 2015-11-05 (×2): 10 mg via ORAL
  Filled 2015-11-04 (×2): qty 1

## 2015-11-04 MED ORDER — ALPRAZOLAM 0.5 MG PO TABS
0.5000 mg | ORAL_TABLET | Freq: Three times a day (TID) | ORAL | Status: DC
  Administered 2015-11-04 – 2015-11-05 (×5): 0.5 mg via ORAL
  Filled 2015-11-04 (×5): qty 1

## 2015-11-04 MED ORDER — TRAZODONE HCL 100 MG PO TABS: 200.0000 mg | ORAL_TABLET | Freq: Every day | ORAL | Status: DC

## 2015-11-04 MED ORDER — METHYLPREDNISOLONE SODIUM SUCC 125 MG IJ SOLR
60.0000 mg | Freq: Two times a day (BID) | INTRAMUSCULAR | Status: DC
  Administered 2015-11-04 – 2015-11-05 (×2): 60 mg via INTRAVENOUS
  Filled 2015-11-04 (×2): qty 2

## 2015-11-04 MED ORDER — IPRATROPIUM-ALBUTEROL 0.5-2.5 (3) MG/3ML IN SOLN
3.0000 mL | Freq: Three times a day (TID) | RESPIRATORY_TRACT | Status: DC
  Administered 2015-11-05 (×2): 3 mL via RESPIRATORY_TRACT
  Filled 2015-11-04 (×2): qty 3

## 2015-11-04 NOTE — Progress Notes (Signed)
PROGRESS NOTE    Shannon Morse [id number] DOB: 1956-10-13 DOA: 11/01/2015 PCP: Lorelei Pont, MD  Primary Cardiologist: Dr. Claudie Leach at [facility] Primary Pulmonologist: Dr. Glade Lloyd  HPI/Brief narrative 60 year old female patient with history of COPD, chronic respiratory failure on home oxygen 2 L/m, GERD, tobacco abuse, mitral regurgitation, chronic pain, HTN, anxiety & depression, OSA-not on CPAP, normal coronary by cath 03/2013, stage IV chronic kidney disease, presented to the [facility] [place] ED on 11/01/15 with one-week history of progressive dyspnea. Received a dose of steroid injection at the PCPs office 1 week ago without significant improvement. EKG showed sinus tachycardia. Chest x-ray without acute findings. Transferred to [facility] for COPD exacerbation management.   Assessment/Plan:   COPD exacerbation precipitated by PNA - Treating with oxygen, bronchodilator nebulizations, IV Solu-Medrol, empiric antibiotics. - Clinically improving. Taper IV Solu-Medrol to 60 mg IV every 12 hours for today and transition to oral prednisone taper at discharge tomorrow - Added flutter valve.  Community Acquired PNA - Neg CXR. CTA chest showed no PE but pulmonary nodules suggestive of infectious process - Continue IV Ceftriaxone & Azithromycin. Transition to oral antibiotics at discharge tomorrow. - We will need follow-up CT chest in 4-6 weeks to ensure resolution of above findings especially given prior history of tobacco abuse.  Acute on chronic respiratory failure with hypoxia - Precipitated by pneumonia and COPD exacerbation. Treat same and wean oxygen as tolerated to home level.  Anemia - stable  DM 2 - continue SSI. Reasonable in patient control  Tobacco Abuse - quit 2013  Hypokalemia - replaced > 4. Mg 2.1  NSVT - noted on telemetry- continue - asymptomatic - K and Mg OK. Change PRN nebs to Xopenex. Check Echo: Results as below. Likely pptd by resp  status. - EKG 11/02/15 personally reviewed: ST at 101 bpm, nonspecific ST-T changes and QTC 459 ms. - No further episodes on monitor. - Patient states that she follows with Dr. Claudie Leach, Cardiology at [facility] and is told to have "irregular heartbeats" and "leaking valve". - No further episodes. DC telemetry.  Seizure disorder - Reviewed completed home med rec. Does not take Keppra.  HLD - Continue statins  Chronic pain - Follows with her PCP who has been tapering down her medications.  Anxiety - Continue home medications.    DVT prophylaxis: Lovenox Code Status: Full Family Communication: None at bedside Disposition Plan: DC home when medically stable-possibly 1/15   Consultants:  None  Procedures:  2-D echo 11/03/15: Study Conclusions  - Left ventricle: The cavity size was normal. Wall thickness was increased in a pattern of mild LVH. Systolic function was normal. The estimated ejection fraction was in the range of 60% to 65%. Wall motion was normal; there were no regional wall motion abnormalities. Doppler parameters are consistent with abnormal left ventricular relaxation (grade 1 diastolic dysfunction). The E/e&' ratio is >15, suggesting elevated LV filling pressure. - Mitral valve: Mildly thickened leaflets . There was mild to moderate regurgitation. - Left atrium: The atrium was normal in size. - Inferior vena cava: The vessel was normal in size. The respirophasic diameter changes were in the normal range (>= 50%), consistent with normal central venous pressure.  Impressions:  - Compared to a prior study in 2014, the EF is higher at 60-65%. The degree of MR is still mild to moderate.  Antimicrobials:  IV Rocephin 1/11 >  Azithromycin 1/11 >   Subjective: Breathing continues to improve. Complaining about diet and wants regular  food. Denies any other complaints.  Objective: Filed Vitals:   11/04/15 0617 11/04/15 0746  11/04/15 1310 11/04/15 1330  BP: 146/66  143/61   Pulse: 90  74   Temp: 97.7 F (36.5 C)  97.5 F (36.4 C)   TempSrc: Oral  Oral   Resp: 18  18   Height:      Weight:      SpO2: 100% 99% 98% 96%    Intake/Output Summary (Last 24 hours) at 11/04/15 1508 Last data filed at 11/04/15 0629  Gross per 24 hour  Intake    770 ml  Output    750 ml  Net     20 ml   Filed Weights   11/01/15 1014 11/01/15 1813  Weight: 73.936 kg (163 lb) 73.936 kg (163 lb)    Exam:  General exam: Pleasant middle-aged female sitting up comfortably on chair this morning.  Respiratory system: Distant breath sounds but clear to auscultation without wheezing, rhonchi or crackles. No increased work of breathing. Cardiovascular system: S1 & S2 heard, RRR. No JVD, murmurs, gallops, clicks or pedal edema. Telemetry: SR. occasional ST. No further NSVTs. Gastrointestinal system: Abdomen is nondistended, soft and nontender. Normal bowel sounds heard. Central nervous system: Alert and oriented. No focal neurological deficits. Extremities: Symmetric 5 x 5 power.   Data Reviewed: Basic Metabolic Panel:  Recent Labs Lab 11/01/15 1105 11/01/15 1839 11/02/15 0459 11/02/15 1224  NA 143  --  140  --   K 3.2*  --  4.1  --   CL 95*  --  101  --   CO2 38*  --  34*  --   GLUCOSE 133*  --  161*  --   BUN 12  --  17  --   CREATININE 0.58 0.62 0.59  --   CALCIUM 9.4  --  9.3  --   MG  --  2.0  --  2.1   Liver Function Tests:  Recent Labs Lab 11/02/15 0459  AST 17  ALT 16  ALKPHOS 58  BILITOT 0.4  PROT 6.1*  ALBUMIN 3.2*   No results for input(s): LIPASE, AMYLASE in the last 168 hours. No results for input(s): AMMONIA in the last 168 hours. CBC:  Recent Labs Lab 11/01/15 1105 11/01/15 1839 11/02/15 0459  WBC 9.4 7.3 6.7  NEUTROABS 7.9*  --   --   HGB 11.9* 11.3* 10.7*  HCT 37.9 36.5 33.3*  MCV 93.8 92.6 93.0  PLT 235 234 201   Cardiac Enzymes:  Recent Labs Lab 11/01/15 1839  11/02/15 0027 11/02/15 0459  TROPONINI <0.03 <0.03 <0.03   BNP (last 3 results) No results for input(s): PROBNP in the last 8760 hours. CBG:  Recent Labs Lab 11/03/15 1230 11/03/15 1704 11/03/15 2107 11/04/15 0730 11/04/15 1211  GLUCAP 103* 146* 137* 174* 114*    No results found for this or any previous visit (from the past 240 hour(s)).       Studies: No results found.      Scheduled Meds: . ALPRAZolam  0.5 mg Oral TID  . aspirin EC  81 mg Oral QODAY  . azithromycin  500 mg Oral q1800  . budesonide-formoterol  2 puff Inhalation BID  . busPIRone  15 mg Oral TID  . cefTRIAXone (ROCEPHIN)  IV  1 g Intravenous Q24H  . cyclobenzaprine  10 mg Oral QHS  . docusate sodium  100 mg Oral BID  . enoxaparin (LOVENOX) injection  40 mg Subcutaneous Q24H  .  fluticasone  1 spray Each Nare BID  . gabapentin  400 mg Oral QHS  . guaiFENesin  1,200 mg Oral BID  . insulin aspart  0-9 Units Subcutaneous TID WC  . ipratropium-albuterol  3 mL Nebulization Q6H  . Linaclotide  145 mcg Oral Daily  . loratadine  10 mg Oral Daily  . methylPREDNISolone (SOLU-MEDROL) injection  60 mg Intravenous Q12H  . montelukast  10 mg Oral BH-q7a  . oxyCODONE  40 mg Oral BID  . pantoprazole  40 mg Oral BID  . pramipexole  1 mg Oral QHS  . simvastatin  20 mg Oral QPM  . sodium chloride  3 mL Intravenous Q12H  . traZODone  200 mg Oral QHS  . varenicline  1 mg Oral BID   Continuous Infusions:    Principal Problem:   COPD (chronic obstructive pulmonary disease) (HCC) Active Problems:   DM (diabetes mellitus) ([place])   Obesity   History of tobacco abuse   COPD exacerbation (HCC)   Acute exacerbation of COPD with asthma ([place])    Time spent: 15 minutes.    Vernell Leep, MD, FACP, FHM. [facility] Pager [phone]  If 7PM-7AM, please contact night-coverage [email or web address] Password [email or web address] 11/04/2015, 3:08 PM    LOS: 3 days

## 2015-11-05 LAB — T3, FREE: T3 FREE: 1.5 pg/mL — AB (ref 2.0–4.4)

## 2015-11-05 LAB — GLUCOSE, CAPILLARY
Glucose-Capillary: 74 mg/dL (ref 65–99)
Glucose-Capillary: 237 mg/dL — ABNORMAL HIGH (ref 65–99)

## 2015-11-05 MED ORDER — PREDNISONE 10 MG PO TABS: 50.0000 mg | ORAL_TABLET | Freq: Every day | ORAL | Status: DC

## 2015-11-05 MED ORDER — OXYCODONE HCL 15 MG PO TABS: 15.0000 mg | ORAL_TABLET | Freq: Three times a day (TID) | ORAL | Status: DC | PRN

## 2015-11-05 MED ORDER — PREDNISONE 50 MG PO TABS
50.0000 mg | ORAL_TABLET | Freq: Every day | ORAL | Status: DC
  Administered 2015-11-05: 50 mg via ORAL
  Filled 2015-11-05: qty 1

## 2015-11-05 MED ORDER — CEFUROXIME AXETIL 500 MG PO TABS: 500.0000 mg | ORAL_TABLET | Freq: Two times a day (BID) | ORAL | Status: DC

## 2015-11-05 NOTE — Discharge Summary (Addendum)
Physician Discharge Summary  Shannon Morse [id number] DOB: 1955-11-24 DOA: 11/01/2015  PCP: Lorelei Pont, MD  Primary Cardiologist: Dr. Claudie Leach at [facility] Primary Pulmonologist: Dr. Glade Lloyd  Admit date: 11/01/2015 Discharge date: 11/05/2015  Time spent: Greater than 30 minutes  Recommendations for Outpatient Follow-up:  1. Dr. Lorelei Pont, PCP in 5 days with repeat labs (CBC & BMP). 2. Recommend follow-up CT chest in 4-6 weeks to ensure resolution of below findings especially given prior history of tobacco abuse  Discharge Diagnoses:  Principal Problem:   COPD (chronic obstructive pulmonary disease) ([place]) Active Problems:   DM (diabetes mellitus) ([place])   Obesity   History of tobacco abuse   COPD exacerbation ([place])   Acute exacerbation of COPD with asthma ([place])   Discharge Condition: Improved & Stable  Diet recommendation: Heart Healthy diet  Filed Weights   11/01/15 1014 11/01/15 1813  Weight: 73.936 kg (163 lb) 73.936 kg (163 lb)    History of present illness:  60 year old female patient with history of COPD, chronic respiratory failure on home oxygen 2 L/m, GERD, tobacco abuse, mitral regurgitation, chronic pain, HTN, anxiety & depression, OSA-not on CPAP, normal coronary by cath 03/2013, stage IV chronic kidney disease, presented to [facility] [place] [facility] on 11/01/15 with one-week history of progressive dyspnea. Received a dose of steroid injection at the PCPs office 1 week ago without significant improvement. EKG showed sinus tachycardia. Chest x-ray without acute findings. Transferred to [facility] for COPD exacerbation management. Improving. Possible DC 1/16.  Hospital Course:   COPD exacerbation precipitated by PNA - Treating with oxygen, bronchodilator nebulizations, IV Solu-Medrol, empiric antibiotics. - Clinically improving. Transitioned to oral prednisone taper today and likely discharge tomorrow - Added flutter valve. - OP follow up with  Pulmonology as needed.  Community Acquired PNA - Neg CXR. CTA chest showed no PE but pulmonary nodules suggestive of infectious process - Continue IV Ceftriaxone & Azithromycin. Transition to oral Ceftin at discharge. - We will need follow-up CT chest in 4-6 weeks to ensure resolution of above findings especially given prior history of tobacco abuse.  Acute on chronic respiratory failure with hypoxia - Precipitated by pneumonia and COPD exacerbation. Treat same and wean oxygen as tolerated to home level 2 L/min.  Anemia - stable  DM 2 - continue SSI. Reasonable in patient control  Tobacco Abuse - quit 2013  Hypokalemia - replaced > 4. Mg 2.1  NSVT - noted on telemetry- continue - asymptomatic - K and Mg OK. Change PRN nebs to Xopenex. Check Echo: Results as below. Likely pptd by resp status. - EKG 11/02/15 personally reviewed: ST at 101 bpm, nonspecific ST-T changes and QTC 459 ms. - No further episodes on monitor. - Patient states that she follows with Dr. Claudie Leach, Cardiology at [facility] and is told to have "irregular heartbeats" and "leaking valve". - No further episodes. DC telemetry 1/14. - OP follow up with Cardiology as needed.  Seizure disorder - Reviewed completed home med rec. Does not take Keppra.  HLD - Continue statins  Chronic pain - Follows with her PCP who has been tapering down her medications.  Anxiety - Continue home medications.  Pulmonary Nodules - seen on CT and per radiologist, compatible with PNA - follow up Chest CT as discussed above.    DVT prophylaxis: Lovenox Code Status: Full Family Communication: None at bedside Disposition Plan: DC home when medically stable-possibly 1/16   Consultants:  None  Procedures:  2-D echo 11/03/15: Study Conclusions  -  Left ventricle: The cavity size was normal. Wall thickness was increased in a pattern of mild LVH. Systolic function was normal. The estimated ejection fraction  was in the range of 60% to 65%. Wall motion was normal; there were no regional wall motion abnormalities. Doppler parameters are consistent with abnormal left ventricular relaxation (grade 1 diastolic dysfunction). The E/e&' ratio is >15, suggesting elevated LV filling pressure. - Mitral valve: Mildly thickened leaflets . There was mild to moderate regurgitation. - Left atrium: The atrium was normal in size. - Inferior vena cava: The vessel was normal in size. The respirophasic diameter changes were in the normal range (>= 50%), consistent with normal central venous pressure.  Impressions:  - Compared to a prior study in 2014, the EF is higher at 60-65%. The degree of MR is still mild to moderate.  Antimicrobials:  IV Rocephin 1/11 >  Azithromycin 1/11 >   Discharge Exam:  Complaints: States breathing is back to baseline and insists on leaving this afternoon and does not want to stay another night.  Filed Vitals:   11/05/15 0532 11/05/15 0857 11/05/15 1400 11/05/15 1446  BP: 130/59  140/84   Pulse: 99  88   Temp: 97.5 F (36.4 C)  98 F (36.7 C)   TempSrc: Oral  Oral   Resp: 22  20   Height:      Weight:      SpO2: 100% 96% 96% 98%    General exam: Pleasant middle-aged female sitting up comfortably in chair this morning.  Respiratory system: Distant breath sounds but clear to auscultation without wheezing, rhonchi or crackles. No increased work of breathing. Cardiovascular system: S1 & S2 heard, RRR. No JVD, murmurs, gallops, clicks or pedal edema.  Gastrointestinal system: Abdomen is nondistended, soft and nontender. Normal bowel sounds heard. Central nervous system: Alert and oriented. No focal neurological deficits. Extremities: Symmetric 5 x 5 power.  Discharge Instructions      Discharge Instructions    Call MD for:  difficulty breathing, headache or visual disturbances    Complete by:  As directed      Call MD for:  extreme fatigue     Complete by:  As directed      Call MD for:  persistant dizziness or light-headedness    Complete by:  As directed      Call MD for:  persistant nausea and vomiting    Complete by:  As directed      Call MD for:  severe uncontrolled pain    Complete by:  As directed      Call MD for:  temperature >100.4    Complete by:  As directed      Diet - low sodium heart healthy    Complete by:  As directed      Discharge instructions    Complete by:  As directed   Continue home oxygen at prior 2 litres/min continuously.     Increase activity slowly    Complete by:  As directed             Medication List    STOP taking these medications        CYANOCOBALAMIN PO     DSS 100 MG Caps     levETIRAcetam 500 MG tablet  Commonly known as:  KEPPRA     nitroGLYCERIN 0.4 MG SL tablet  Commonly known as:  NITROSTAT     VITAMIN B-12 PO     VITAMIN D PO  TAKE these medications        albuterol (2.5 MG/3ML) 0.083% nebulizer solution  Commonly known as:  PROVENTIL  Take 2.5 mg by nebulization every 6 (six) hours as needed for wheezing or shortness of breath.     albuterol 108 (90 Base) MCG/ACT inhaler  Commonly known as:  PROVENTIL HFA;VENTOLIN HFA  Inhale 2 puffs into the lungs every 6 (six) hours as needed for wheezing or shortness of breath.     ALPRAZolam 0.5 MG tablet  Commonly known as:  XANAX  Take 0.5 mg by mouth 3 (three) times daily.     aspirin EC 81 MG tablet  Take 81 mg by mouth every other day.     budesonide-formoterol 160-4.5 MCG/ACT inhaler  Commonly known as:  SYMBICORT  Inhale 2 puffs into the lungs 2 (two) times daily.     busPIRone 15 MG tablet  Commonly known as:  BUSPAR  Take 30 mg by mouth 2 (two) times daily.     cefUROXime 500 MG tablet  Commonly known as:  CEFTIN  Take 1 tablet (500 mg total) by mouth 2 (two) times daily with a meal.     CHANTIX 1 MG tablet  Generic drug:  varenicline  Take 1 mg by mouth 2 (two) times daily.      cyclobenzaprine 10 MG tablet  Commonly known as:  FLEXERIL  Take 10 mg by mouth at bedtime.     fluticasone 50 MCG/ACT nasal spray  Commonly known as:  FLONASE  Place 1 spray into both nostrils 2 (two) times daily.     gabapentin 400 MG capsule  Commonly known as:  NEURONTIN  Take 400 mg by mouth at bedtime.     LINZESS 145 MCG Caps capsule  Generic drug:  Linaclotide  Take 145 mcg by mouth daily.     loratadine 10 MG tablet  Commonly known as:  CLARITIN  Take 10 mg by mouth daily.     montelukast 10 MG tablet  Commonly known as:  SINGULAIR  Take 10 mg by mouth every morning.     OXYCONTIN 40 mg 12 hr tablet  Generic drug:  oxyCODONE  Take 40 mg by mouth 2 (two) times daily.     oxyCODONE 15 MG immediate release tablet  Commonly known as:  ROXICODONE  Take 1 tablet (15 mg total) by mouth 3 (three) times daily as needed for pain.     pantoprazole 40 MG tablet  Commonly known as:  PROTONIX  Take 40 mg by mouth 2 (two) times daily.     pramipexole 1 MG tablet  Commonly known as:  MIRAPEX  Take 1 mg by mouth at bedtime.     predniSONE 10 MG tablet  Commonly known as:  DELTASONE  Take 5 tablets (50 mg total) by mouth daily with breakfast. Taper down by 10 mg every 2 days to discontinue.     promethazine 25 MG tablet  Commonly known as:  PHENERGAN  Take 25 mg by mouth every 6 (six) hours as needed for nausea or vomiting.     simvastatin 20 MG tablet  Commonly known as:  ZOCOR  Take 20 mg by mouth every evening.     tiotropium 18 MCG inhalation capsule  Commonly known as:  SPIRIVA  Place 18 mcg into inhaler and inhale daily.     traZODone 100 MG tablet  Commonly known as:  DESYREL  Take 200 mg by mouth at bedtime.  Follow-up Information    Follow up with PARUCHURI,VAMSEE, MD. Schedule an appointment as soon as possible for a visit in 5 days.   Specialty:  Internal Medicine   Why:  To be seen with repeat labs (CBc & BMP).       The results of  significant diagnostics from this hospitalization (including imaging, microbiology, ancillary and laboratory) are listed below for reference.    Significant Diagnostic Studies: Ct Angio Chest Pe W/cm &/or Wo Cm  11/01/2015  CLINICAL DATA:  60 year old female with shortness of breath. EXAM: CT ANGIOGRAPHY CHEST WITH CONTRAST TECHNIQUE: Multidetector CT imaging of the chest was performed using the standard protocol during bolus administration of intravenous contrast. Multiplanar CT image reconstructions and MIPs were obtained to evaluate the vascular anatomy. CONTRAST:  120mL OMNIPAQUE IOHEXOL 350 MG/ML SOLN COMPARISON:  Chest radiograph dated 11/01/2015 FINDINGS: There is mild emphysematous changes of the lungs. There are innumerable small nodular densities predominantly involving the right upper and right lower low most compatible with an infectious process and likely an atypical pneumonia. There is no focal consolidation, pleural effusion, or pneumothorax. The central airways are patent. The thoracic aorta appears unremarkable. No CT evidence of pulmonary embolism. There is no hilar or mediastinal adenopathy. No cardiomegaly or pericardial effusion. The esophagus is grossly unremarkable. No thyroid nodule identified. There is no axillary adenopathy the chest wall soft tissues appear unremarkable. There is degenerative changes of the spine. Partially visualized anterior cervical fixation plate and screws. No acute fracture. The visualized upper abdomen appears unremarkable. Review of the MIP images confirms the above findings. IMPRESSION: No CT evidence of pulmonary embolism. Innumerable small pulmonary nodules most compatible with pneumonia. Clinical correlation and follow-up recommended. Electronically Signed   By: Anner Crete M.D.   On: 11/01/2015 23:23   Dg Chest Port 1 View  11/01/2015  CLINICAL DATA:  Shortness of breath. EXAM: PORTABLE CHEST 1 VIEW COMPARISON:  01/19/2015 FINDINGS: There is mild  bilateral interstitial thickening. There is no focal parenchymal opacity. There is no pleural effusion or pneumothorax. The heart and mediastinal contours are unremarkable. There is evidence of prior right posterior rib fracture. There is prior anterior cervical fusion. IMPRESSION: No active disease. Electronically Signed   By: Kathreen Devoid   On: 11/01/2015 10:59    Microbiology: No results found for this or any previous visit (from the past 240 hour(s)).   Labs: Basic Metabolic Panel:  Recent Labs Lab 11/01/15 1105 11/01/15 1839 11/02/15 0459 11/02/15 1224  NA 143  --  140  --   K 3.2*  --  4.1  --   CL 95*  --  101  --   CO2 38*  --  34*  --   GLUCOSE 133*  --  161*  --   BUN 12  --  17  --   CREATININE 0.58 0.62 0.59  --   CALCIUM 9.4  --  9.3  --   MG  --  2.0  --  2.1   Liver Function Tests:  Recent Labs Lab 11/02/15 0459  AST 17  ALT 16  ALKPHOS 58  BILITOT 0.4  PROT 6.1*  ALBUMIN 3.2*   No results for input(s): LIPASE, AMYLASE in the last 168 hours. No results for input(s): AMMONIA in the last 168 hours. CBC:  Recent Labs Lab 11/01/15 1105 11/01/15 1839 11/02/15 0459  WBC 9.4 7.3 6.7  NEUTROABS 7.9*  --   --   HGB 11.9* 11.3* 10.7*  HCT 37.9 36.5  33.3*  MCV 93.8 92.6 93.0  PLT 235 234 201   Cardiac Enzymes:  Recent Labs Lab 11/01/15 1839 11/02/15 0027 11/02/15 0459  TROPONINI <0.03 <0.03 <0.03   BNP: BNP (last 3 results) No results for input(s): BNP in the last 8760 hours.  ProBNP (last 3 results) No results for input(s): PROBNP in the last 8760 hours.  CBG:  Recent Labs Lab 11/04/15 1211 11/04/15 1643 11/04/15 2112 11/05/15 0742 11/05/15 1157  GLUCAP 114* 112* 122* 74 237*       Signed:  Renatha Rosen, MD, FACP, FHM. [facility] Pager [phone]  If 7PM-7AM, please contact night-coverage [email or web address] Password [facility] 11/05/2015, 4:41 PM

## 2015-11-05 NOTE — Discharge Instructions (Signed)
Chronic Obstructive Pulmonary Disease Chronic obstructive pulmonary disease (COPD) is a common lung condition in which airflow from the lungs is limited. COPD is a general term that can be used to describe many different lung problems that limit airflow, including both chronic bronchitis and emphysema. If you have COPD, your lung function will probably never return to normal, but there are measures you can take to improve lung function and make yourself feel better. CAUSES   Smoking (common).  Exposure to secondhand smoke.  Genetic problems.  Chronic inflammatory lung diseases or recurrent infections. SYMPTOMS  Shortness of breath, especially with physical activity.  Deep, persistent (chronic) cough with a large amount of thick mucus.  Wheezing.  Rapid breaths (tachypnea).  Gray or bluish discoloration (cyanosis) of the skin, especially in your fingers, toes, or lips.  Fatigue.  Weight loss.  Frequent infections or episodes when breathing symptoms become much worse (exacerbations).  Chest tightness. DIAGNOSIS Your health care provider will take a medical history and perform a physical examination to diagnose COPD. Additional tests for COPD may include:  Lung (pulmonary) function tests.  Chest X-ray.  CT scan.  Blood tests. TREATMENT  Treatment for COPD may include:  Inhaler and nebulizer medicines. These help manage the symptoms of COPD and make your breathing more comfortable.  Supplemental oxygen. Supplemental oxygen is only helpful if you have a low oxygen level in your blood.  Exercise and physical activity. These are beneficial for nearly all people with COPD.  Lung surgery or transplant.  Nutrition therapy to gain weight, if you are underweight.  Pulmonary rehabilitation. This may involve working with a team of health care providers and specialists, such as respiratory, occupational, and physical therapists. HOME CARE INSTRUCTIONS  Take all medicines  (inhaled or pills) as directed by your health care provider.  Avoid over-the-counter medicines or cough syrups that dry up your airway (such as antihistamines) and slow down the elimination of secretions unless instructed otherwise by your health care provider.  If you are a smoker, the most important thing that you can do is stop smoking. Continuing to smoke will cause further lung damage and breathing trouble. Ask your health care provider for help with quitting smoking. He or she can direct you to community resources or hospitals that provide support.  Avoid exposure to irritants such as smoke, chemicals, and fumes that aggravate your breathing.  Use oxygen therapy and pulmonary rehabilitation if directed by your health care provider. If you require home oxygen therapy, ask your health care provider whether you should purchase a pulse oximeter to measure your oxygen level at home.  Avoid contact with individuals who have a contagious illness.  Avoid extreme temperature and humidity changes.  Eat healthy foods. Eating smaller, more frequent meals and resting before meals may help you maintain your strength.  Stay active, but balance activity with periods of rest. Exercise and physical activity will help you maintain your ability to do things you want to do.  Preventing infection and hospitalization is very important when you have COPD. Make sure to receive all the vaccines your health care provider recommends, especially the pneumococcal and influenza vaccines. Ask your health care provider whether you need a pneumonia vaccine.  Learn and use relaxation techniques to manage stress.  Learn and use controlled breathing techniques as directed by your health care provider. Controlled breathing techniques include:  Pursed lip breathing. Start by breathing in (inhaling) through your nose for 1 second. Then, purse your lips as if you were  going to whistle and breathe out (exhale) through the  pursed lips for 2 seconds.  Diaphragmatic breathing. Start by putting one hand on your abdomen just above your waist. Inhale slowly through your nose. The hand on your abdomen should move out. Then purse your lips and exhale slowly. You should be able to feel the hand on your abdomen moving in as you exhale.  Learn and use controlled coughing to clear mucus from your lungs. Controlled coughing is a series of short, progressive coughs. The steps of controlled coughing are:  Lean your head slightly forward.  Breathe in deeply using diaphragmatic breathing.  Try to hold your breath for 3 seconds.  Keep your mouth slightly open while coughing twice.  Spit any mucus out into a tissue.  Rest and repeat the steps once or twice as needed. SEEK MEDICAL CARE IF:  You are coughing up more mucus than usual.  There is a change in the color or thickness of your mucus.  Your breathing is more labored than usual.  Your breathing is faster than usual. SEEK IMMEDIATE MEDICAL CARE IF:  You have shortness of breath while you are resting.  You have shortness of breath that prevents you from:  Being able to talk.  Performing your usual physical activities.  You have chest pain lasting longer than 5 minutes.  Your skin color is more cyanotic than usual.  You measure low oxygen saturations for longer than 5 minutes with a pulse oximeter. MAKE SURE YOU:  Understand these instructions.  Will watch your condition.  Will get help right away if you are not doing well or get worse.   This information is not intended to replace advice given to you by your health care provider. Make sure you discuss any questions you have with your health care provider.   Document Released: 07/17/2005 Document Revised: 10/28/2014 Document Reviewed: 06/03/2013 Elsevier Interactive Patient Education 2016 [place] Pneumonia, Adult Pneumonia is an infection of the lungs. There are  different types of pneumonia. One type can develop while a person is in a hospital. A different type, called community-acquired pneumonia, develops in people who are not, or have not recently been, in the hospital or other health care facility.  CAUSES Pneumonia may be caused by bacteria, viruses, or funguses. Community-acquired pneumonia is often caused by Streptococcus pneumonia bacteria. These bacteria are often passed from one person to another by breathing in droplets from the cough or sneeze of an infected person. RISK FACTORS The condition is more likely to develop in:  People who havechronic diseases, such as chronic obstructive pulmonary disease (COPD), asthma, congestive heart failure, cystic fibrosis, diabetes, or kidney disease.  People who haveearly-stage or late-stage HIV.  People who havesickle cell disease.  People who havehad their spleen removed (splenectomy).  People who havepoor Human resources officer.  People who havemedical conditions that increase the risk of breathing in (aspirating) secretions their own mouth and nose.   People who havea weakened immune system (immunocompromised).  People who smoke.  People whotravel to areas where pneumonia-causing germs commonly exist.  People whoare around animal habitats or animals that have pneumonia-causing germs, including birds, bats, rabbits, cats, and farm animals. SYMPTOMS Symptoms of this condition include:  Adry cough.  A wet (productive) cough.  Fever.  Sweating.  Chest pain, especially when breathing deeply or coughing.  Rapid breathing or difficulty breathing.  Shortness of breath.  Shaking chills.  Fatigue.  Muscle aches. DIAGNOSIS Your health care provider will take  a medical history and perform a physical exam. You may also have other tests, including:  Imaging studies of your chest, including X-rays.  Tests to check your blood oxygen level and other blood gases.  Other tests on  blood, mucus (sputum), fluid around your lungs (pleural fluid), and urine. If your pneumonia is severe, other tests may be done to identify the specific cause of your illness. TREATMENT The type of treatment that you receive depends on many factors, such as the cause of your pneumonia, the medicines you take, and other medical conditions that you have. For most adults, treatment and recovery from pneumonia may occur at home. In some cases, treatment must happen in a hospital. Treatment may include:  Antibiotic medicines, if the pneumonia was caused by bacteria.  Antiviral medicines, if the pneumonia was caused by a virus.  Medicines that are given by mouth or through an IV tube.  Oxygen.  Respiratory therapy. Although rare, treating severe pneumonia may include:  Mechanical ventilation. This is done if you are not breathing well on your own and you cannot maintain a safe blood oxygen level.  Thoracentesis. This procedureremoves fluid around one lung or both lungs to help you breathe better. HOME CARE INSTRUCTIONS  Take over-the-counter and prescription medicines only as told by your health care provider.  Only takecough medicine if you are losing sleep. Understand that cough medicine can prevent your body's natural ability to remove mucus from your lungs.  If you were prescribed an antibiotic medicine, take it as told by your health care provider. Do not stop taking the antibiotic even if you start to feel better.  Sleep in a semi-upright position at night. Try sleeping in a reclining chair, or place a few pillows under your head.  Do not use tobacco products, including cigarettes, chewing tobacco, and e-cigarettes. If you need help quitting, ask your health care provider.  Drink enough water to keep your urine clear or pale yellow. This will help to thin out mucus secretions in your lungs. PREVENTION There are ways that you can decrease your risk of developing community-acquired  pneumonia. Consider getting a pneumococcal vaccine if:  You are older than 58 years of age.  You are older than 59 years of age and are undergoing cancer treatment, have chronic lung disease, or have other medical conditions that affect your immune system. Ask your health care provider if this applies to you. There are different types and schedules of pneumococcal vaccines. Ask your health care provider which vaccination option is best for you. You may also prevent community-acquired pneumonia if you take these actions:  Get an influenza vaccine every year. Ask your health care provider which type of influenza vaccine is best for you.  Go to the dentist on a regular basis.  Wash your hands often. Use hand sanitizer if soap and water are not available. SEEK MEDICAL CARE IF:  You have a fever.  You are losing sleep because you cannot control your cough with cough medicine. SEEK IMMEDIATE MEDICAL CARE IF:  You have worsening shortness of breath.  You have increased chest pain.  Your sickness becomes worse, especially if you are an older adult or have a weakened immune system.  You cough up blood.   This information is not intended to replace advice given to you by your health care provider. Make sure you discuss any questions you have with your health care provider.   Document Released: 10/07/2005 Document Revised: 06/28/2015 Document Reviewed: 02/01/2015 Elsevier Interactive  Patient Education 2016 [employer].

## 2015-11-05 NOTE — Progress Notes (Signed)
PROGRESS NOTE    Shannon Morse [id number] DOB: 23-Feb-1956 DOA: 11/01/2015 PCP: Lorelei Pont, MD  Primary Cardiologist: Dr. Claudie Leach at [facility] Primary Pulmonologist: Dr. Glade Lloyd  HPI/Brief narrative 60 year old female patient with history of COPD, chronic respiratory failure on home oxygen 2 L/m, GERD, tobacco abuse, mitral regurgitation, chronic pain, HTN, anxiety & depression, OSA-not on CPAP, normal coronary by cath 03/2013, stage IV chronic kidney disease, presented [facility] [place] [facility] on 11/[facility]/15 with one-week history of progressive dyspnea. Received a dose of steroid injection at the PCPs office 1 week ago without significant improvement. EKG showed sinus tachycardia. Chest x-ray without acute findings. Transferred to [facility] for COPD exacerbation management. Improving. Possible DC 1/16.   Assessment/Plan:   COPD exacerbation precipitated by PNA - Treating with oxygen, bronchodilator nebulizations, IV Solu-Medrol, empiric antibiotics. - Clinically improving. Transitioned to oral prednisone taper today and likely discharge tomorrow - Added flutter valve.  Community Acquired PNA - Neg CXR. CTA chest showed no PE but pulmonary nodules suggestive of infectious process - Continue IV Ceftriaxone & Azithromycin. Transition to oral antibiotics at discharge. - We will need follow-up CT chest in 4-6 weeks to ensure resolution of above findings especially given prior history of tobacco abuse.  Acute on chronic respiratory failure with hypoxia - Precipitated by pneumonia and COPD exacerbation. Treat same and wean oxygen as tolerated to home level 2 L/min.  Anemia - stable  DM 2 - continue SSI. Reasonable in patient control  Tobacco Abuse - quit 2013  Hypokalemia - replaced > 4. Mg 2.1  NSVT - noted on telemetry- continue - asymptomatic - K and Mg OK. Change PRN nebs to Xopenex. Check Echo: Results as below. Likely pptd by resp status. - EKG 11/02/15  personally reviewed: ST at 101 bpm, nonspecific ST-T changes and QTC 459 ms. - No further episodes on monitor. - Patient states that she follows with Dr. Claudie Leach, Cardiology at [facility] and is told to have "irregular heartbeats" and "leaking valve". - No further episodes. DC telemetry 1/14.  Seizure disorder - Reviewed completed home med rec. Does not take Keppra.  HLD - Continue statins  Chronic pain - Follows with her PCP who has been tapering down her medications.  Anxiety - Continue home medications.    DVT prophylaxis: Lovenox Code Status: Full Family Communication: None at bedside Disposition Plan: DC home when medically stable-possibly 1/16   Consultants:  None  Procedures:  2-D echo 11/03/15: Study Conclusions  - Left ventricle: The cavity size was normal. Wall thickness was increased in a pattern of mild LVH. Systolic function was normal. The estimated ejection fraction was in the range of 60% to 65%. Wall motion was normal; there were no regional wall motion abnormalities. Doppler parameters are consistent with abnormal left ventricular relaxation (grade 1 diastolic dysfunction). The E/e&' ratio is >15, suggesting elevated LV filling pressure. - Mitral valve: Mildly thickened leaflets . There was mild to moderate regurgitation. - Left atrium: The atrium was normal in size. - Inferior vena cava: The vessel was normal in size. The respirophasic diameter changes were in the normal range (>= 50%), consistent with normal central venous pressure.  Impressions:  - Compared to a prior study in 2014, the EF is higher at 60-65%. The degree of MR is still mild to moderate.  Antimicrobials:  IV Rocephin 1/11 >  Azithromycin 1/11 >   Subjective: Breathing continues to improve. No new complaints.   Objective: Filed Vitals:   11/04/15  2121 11/05/15 0511 11/05/15 0532 11/05/15 0857  BP: 132/71  130/59   Pulse: 91  99   Temp:  97.7 F (36.5 C)  97.5 F (36.4 C)   TempSrc: Oral  Oral   Resp: 18  22   Height:      Weight:      SpO2: 98% 98% 100% 96%    Intake/Output Summary (Last 24 hours) at 11/05/15 1411 Last data filed at 11/05/15 0532  Gross per 24 hour  Intake    530 ml  Output    250 ml  Net    280 ml   Filed Weights   11/01/15 1014 11/01/15 1813  Weight: 73.936 kg (163 lb) 73.936 kg (163 lb)    Exam:  General exam: Pleasant middle-aged female sitting up comfortably in chair this morning.  Respiratory system: Distant breath sounds but clear to auscultation without wheezing, rhonchi or crackles. No increased work of breathing. Cardiovascular system: S1 & S2 heard, RRR. No JVD, murmurs, gallops, clicks or pedal edema.  Gastrointestinal system: Abdomen is nondistended, soft and nontender. Normal bowel sounds heard. Central nervous system: Alert and oriented. No focal neurological deficits. Extremities: Symmetric 5 x 5 power.   Data Reviewed: Basic Metabolic Panel:  Recent Labs Lab 11/01/15 1105 11/01/15 1839 11/02/15 0459 11/02/15 1224  NA 143  --  140  --   K 3.2*  --  4.1  --   CL 95*  --  101  --   CO2 38*  --  34*  --   GLUCOSE 133*  --  161*  --   BUN 12  --  17  --   CREATININE 0.58 0.62 0.59  --   CALCIUM 9.4  --  9.3  --   MG  --  2.0  --  2.1   Liver Function Tests:  Recent Labs Lab 11/02/15 0459  AST 17  ALT 16  ALKPHOS 58  BILITOT 0.4  PROT 6.1*  ALBUMIN 3.2*   No results for input(s): LIPASE, AMYLASE in the last 168 hours. No results for input(s): AMMONIA in the last 168 hours. CBC:  Recent Labs Lab 11/01/15 1105 11/01/15 1839 11/02/15 0459  WBC 9.4 7.3 6.7  NEUTROABS 7.9*  --   --   HGB 11.9* 11.3* 10.7*  HCT 37.9 36.5 33.3*  MCV 93.8 92.6 93.0  PLT 235 234 201   Cardiac Enzymes:  Recent Labs Lab 11/01/15 1839 11/02/15 0027 11/02/15 0459  TROPONINI <0.03 <0.03 <0.03   BNP (last 3 results) No results for input(s): PROBNP in the last 8760  hours. CBG:  Recent Labs Lab 11/04/15 1211 11/04/15 1643 11/04/15 2112 11/05/15 0742 11/05/15 1157  GLUCAP 114* 112* 122* 74 237*    No results found for this or any previous visit (from the past 240 hour(s)).       Studies: No results found.      Scheduled Meds: . ALPRAZolam  0.5 mg Oral TID  . aspirin EC  81 mg Oral QODAY  . azithromycin  500 mg Oral q1800  . budesonide-formoterol  2 puff Inhalation BID  . busPIRone  15 mg Oral TID  . cefTRIAXone (ROCEPHIN)  IV  1 g Intravenous Q24H  . cyclobenzaprine  10 mg Oral QHS  . docusate sodium  100 mg Oral BID  . enoxaparin (LOVENOX) injection  40 mg Subcutaneous Q24H  . fluticasone  1 spray Each Nare BID  . gabapentin  400 mg Oral QHS  . guaiFENesin  1,200 mg Oral BID  . insulin aspart  0-9 Units Subcutaneous TID WC  . ipratropium-albuterol  3 mL Nebulization TID  . Linaclotide  145 mcg Oral Daily  . loratadine  10 mg Oral Daily  . montelukast  10 mg Oral BH-q7a  . oxyCODONE  40 mg Oral BID  . pantoprazole  40 mg Oral BID  . pramipexole  1 mg Oral QHS  . predniSONE  50 mg Oral Q breakfast  . simvastatin  20 mg Oral QPM  . sodium chloride  3 mL Intravenous Q12H  . traZODone  200 mg Oral QHS  . varenicline  1 mg Oral BID   Continuous Infusions:    Principal Problem:   COPD (chronic obstructive pulmonary disease) (HCC) Active Problems:   DM (diabetes mellitus) ([place])   Obesity   History of tobacco abuse   COPD exacerbation (HCC)   Acute exacerbation of COPD with asthma ([place])    Time spent: 15 minutes.    Vernell Leep, MD, FACP, FHM. [facility] Pager [phone]  If 7PM-7AM, please contact night-coverage [email or web address] Password [email or web address] 11/05/2015, 2:11 PM    LOS: 4 days

## 2016-06-27 ENCOUNTER — Telehealth: Payer: Self-pay | Admitting: Cardiovascular Disease

## 2016-06-27 NOTE — Telephone Encounter (Signed)
Received records from [facility] for appointment on 06/28/16 with Dr Oval Linsey.  Records given to [facility] (medical records) for Dr Blenda Mounts schedule on 06/28/16. lp

## 2016-06-28 ENCOUNTER — Ambulatory Visit (INDEPENDENT_AMBULATORY_CARE_PROVIDER_SITE_OTHER): Payer: Medicare Other | Admitting: Cardiovascular Disease

## 2016-06-28 ENCOUNTER — Encounter: Payer: Self-pay | Admitting: Cardiovascular Disease

## 2016-06-28 VITALS — BP 133/78 | HR 85 | Ht 64.5 in | Wt 181.0 lb

## 2016-06-28 DIAGNOSIS — R9439 Abnormal result of other cardiovascular function study: Secondary | ICD-10-CM

## 2016-06-28 DIAGNOSIS — I10 Essential (primary) hypertension: Secondary | ICD-10-CM

## 2016-06-28 DIAGNOSIS — Z79899 Other long term (current) drug therapy: Secondary | ICD-10-CM

## 2016-06-28 DIAGNOSIS — Z01818 Encounter for other preprocedural examination: Secondary | ICD-10-CM | POA: Diagnosis not present

## 2016-06-28 DIAGNOSIS — Z01812 Encounter for preprocedural laboratory examination: Secondary | ICD-10-CM

## 2016-06-28 DIAGNOSIS — E785 Hyperlipidemia, unspecified: Secondary | ICD-10-CM

## 2016-06-28 HISTORY — DX: Essential (primary) hypertension: I10

## 2016-06-28 HISTORY — DX: Abnormal result of other cardiovascular function study: R94.39

## 2016-06-28 NOTE — Addendum Note (Signed)
Addended by: Alvina Filbert B on: 06/28/2016 02:51 PM   Modules accepted: Orders

## 2016-06-28 NOTE — Patient Instructions (Addendum)
Medication Instructions:  Your physician recommends that you continue on your current medications as directed. Please refer to the Current Medication list given to you today.  Labwork: Cbc/bmet/pt/inr at [facility] lab on the first floor within 2 weeks of your cardiac cath  Testing/Procedures: Your physician has requested that you have a cardiac catheterization. Cardiac catheterization is used to diagnose and/or treat various heart conditions. Doctors may recommend this procedure for a number of different reasons. The most common reason is to evaluate chest pain. Chest pain can be a symptom of coronary artery disease (CAD), and cardiac catheterization can show whether plaque is narrowing or blocking your heart's arteries. This procedure is also used to evaluate the valves, as well as measure the blood flow and oxygen levels in different parts of your heart. For further information please visit [email or web address]. Please follow instruction sheet, as given. WITH DR Gwenlyn Found  Follow-Up: Your physician recommends that you schedule a follow-up appointment in: 1 month ov

## 2016-06-28 NOTE — Progress Notes (Signed)
Cardiology Office Note   Date:  06/28/2016   ID:  LILIAHNA Morse, DOB 11/21/1955, MRN [id number]  PCP:  Shannon Pont, MD  Cardiologist:   Shannon Latch, MD  Referring cardiologist: Dr. Lawson Morse  Chief Complaint  Patient presents with  . Follow-up    sob; when sitting, increases when patient is active. edema; in feet and legs.     History of Present Illness: Shannon Morse is a 60 y.o. female with hypertension, severe pulmonary hypertension, asthma, meningioma s/p removal 2015, and moderate mitral regurgitation who presents for evaluation of an abnormal stress test.  Shannon Morse Was seen at [facility] 05/2016 with shortness of breath, dyspnea on exertion and fatigue.  She notes episodes of chest pressure that lasted up to one hour at a time. It occurs both at rest and with exertion. She rates it as 5 out of 10 in severity and is associated with increased shortness of breath. She denies nausea but does have diaphoresis and dizziness when it occurs. She does not get any formal exercise but notes that it does sometimes happen when she is walking. She has not noted any lower extremity edema, orthopnea, or PND.  Records from her hospitalization are not currently available. However, she reportedly did not have a heart attack and was told to follow-up with cardiology as an outpatient.  She saw Dr. Lawson Morse of [facility] in [facility].   She had a Lexiscan Myoview on 06/13/16 that revealed a moderate apical defect that was not reversible. LVEF was 53%.  Given that she was having continued chest discomfort Dr. Claudie Leach referred her to Shannon Morse for [facility]  He also started her on lisinopril and Imdur, as her blood pressure was above goal at that visit. Shannon Morse had a LHC with Dr. Gwenlyn Morse 03/29/13 that revealed normal coronary arteries and LVEF 60%.  Ms. Sonnek quit smoking one month ago.  She has been using Chantix with success.  She has also been on prednisone  for a COPD exacerbation.  She reports gaining 10 lb since being on prednisone. Gaining weight 2/2 prednisone.   Past Medical History:  Diagnosis Date  . Abnormal stress test 06/28/2016  . Allergic rhinitis   . Anemia   . Anginal pain ([place])    with normal coronaries by cath 03/2013  . Anxiety   . Arthritis    "all over my body"  . Asthma   . Barrett's esophagus   . Bruises easily   . Chronic bronchitis ([place]) "3-4 times/year"  . Chronic pain    "hips, neck, back, head" (11/01/2015"  . CKD (chronic kidney disease), stage IV ([place])    pt has never been told this; does not see kidney doctor (11/01/2015)  . Constipation due to pain medication   . COPD (chronic obstructive pulmonary disease) ([place])   . Coronary artery disease    normal coronaries by 03/2013 cath  . Daily headache   . DDD (degenerative disc disease)   . Depression   . DJD (degenerative joint disease)   . Essential hypertension 06/28/2016  . Essential hypertension 06/28/2016  . Gastritis   . GERD (gastroesophageal reflux disease)   . HSV (herpes simplex virus) infection   . Insomnia   . Mitral regurgitation    mild to moderate by echo 06/2013  . Neuromuscular disorder ([place])   . Obesity   . On home oxygen therapy    "2L; 24/7" (11/01/2015)  . Osteoporosis   .  Osteoporosis   . Pneumonia "several times"  . Restless leg syndrome   . Seasonal allergies   . Shortness of breath    with exertion; cannot lay flat  . Sleep apnea    does not wear machine  (11/01/2015)  . Tobacco abuse   . Vitamin B12 deficiency     Past Surgical History:  Procedure Laterality Date  . ABDOMINAL HYSTERECTOMY  1986   "partial"  . ANKLE FRACTURE SURGERY Left 2010  . ANTERIOR CERVICAL DECOMP/DISCECTOMY FUSION  2010   ; C5-C6 anterior discectomy with fusion  . ANTERIOR CERVICAL DECOMP/DISCECTOMY FUSION N/A 09/28/2013   Procedure: CERVICAL FOUR-FIVE, CERVICAL SIX-SEVEN ANTERIOR CERVICAL DECOMPRESSION/DISCECTOMY FUSION 2 LEVELS;  Surgeon: Shannon Ghee, MD;  Location: [place] NEURO ORS;  Service: Neurosurgery;  Laterality: N/A;  . BACK SURGERY    . BRAIN SURGERY    . CARDIAC CATHETERIZATION  2009  . CARPAL TUNNEL RELEASE Bilateral   . CATARACT EXTRACTION W/ INTRAOCULAR LENS  IMPLANT, BILATERAL Bilateral   . COLONOSCOPY    . CRANIOTOMY Right 09/08/2014   Procedure: Right Frontal Craniotomy w/Curve;  Surgeon: Shannon Ghee, MD;  Location: [place] NEURO ORS;  Service: Neurosurgery;  Laterality: Right;  Right Frontal Craniotomy w/Curve  . CYSTOSCOPY VAGINOSCOPY W/ VAGINAL DILATION    . ESOPHAGOGASTRODUODENOSCOPY (EGD) WITH ESOPHAGEAL DILATION  "several times"  . ESOPHAGOGASTRODUODENOSCOPY ENDOSCOPY    . FRACTURE SURGERY    . LAPAROSCOPIC CHOLECYSTECTOMY    . LEFT HEART CATHETERIZATION WITH CORONARY ANGIOGRAM N/A 03/29/2013   Procedure: LEFT HEART CATHETERIZATION WITH CORONARY ANGIOGRAM;  Surgeon: Shannon Harp, MD;  Location: [facility] CATH LAB;  Service: Cardiovascular;  Laterality: N/A;  . TEE WITHOUT CARDIOVERSION N/A 07/19/2013   Procedure: TRANSESOPHAGEAL ECHOCARDIOGRAM (TEE);  Surgeon: Shannon Klein, MD;  Location: [facility] ENDOSCOPY;  Service: Cardiovascular;  Laterality: N/A;  . TONSILLECTOMY       Current Outpatient Prescriptions  Medication Sig Dispense Refill  . albuterol (PROVENTIL HFA;VENTOLIN HFA) 108 (90 BASE) MCG/ACT inhaler Inhale 2 puffs into the lungs every 6 (six) hours as needed for wheezing or shortness of breath.    Shannon Morse albuterol (PROVENTIL) (2.5 MG/3ML) 0.083% nebulizer solution Take 2.5 mg by nebulization every 6 (six) hours as needed for wheezing or shortness of breath.    . ALPRAZolam (XANAX) 0.5 MG tablet Take 0.5 mg by mouth 3 (three) times daily.  1  . aspirin EC 81 MG tablet Take 81 mg by mouth every other day.     . budesonide-formoterol (SYMBICORT) 160-4.5 MCG/ACT inhaler Inhale 2 puffs into the lungs 2 (two) times daily.    . busPIRone (BUSPAR) 15 MG tablet Take 30 mg by mouth 2 (two) times daily.     . cefUROXime  (CEFTIN) 500 MG tablet Take 1 tablet (500 mg total) by mouth 2 (two) times daily with a meal. 6 tablet 0  . cyclobenzaprine (FLEXERIL) 10 MG tablet Take 10 mg by mouth at bedtime.    . fluticasone (FLONASE) 50 MCG/ACT nasal spray Place 1 spray into both nostrils 2 (two) times daily.    Shannon Morse gabapentin (NEURONTIN) 400 MG capsule Take 400 mg by mouth at bedtime.    . isosorbide dinitrate (ISORDIL) 30 MG tablet Take 30 mg by mouth 2 (two) times daily.    . Linaclotide (LINZESS) 145 MCG CAPS capsule Take 145 mcg by mouth daily.    Shannon Morse lisinopril (PRINIVIL,ZESTRIL) 2.5 MG tablet Take 2.5 mg by mouth daily.    Shannon Morse loratadine (CLARITIN) 10 MG tablet Take 10  mg by mouth daily.     . montelukast (SINGULAIR) 10 MG tablet Take 10 mg by mouth every morning.    Shannon Morse oxyCODONE (ROXICODONE) 15 MG immediate release tablet Take 1 tablet (15 mg total) by mouth 3 (three) times daily as needed for pain.    . pantoprazole (PROTONIX) 40 MG tablet Take 40 mg by mouth 2 (two) times daily.  12  . pramipexole (MIRAPEX) 1 MG tablet Take 1 mg by mouth at bedtime.    . predniSONE (DELTASONE) 10 MG tablet Take 5 tablets (50 mg total) by mouth daily with breakfast. Taper down by 10 mg every 2 days to discontinue. 30 tablet 0  . promethazine (PHENERGAN) 25 MG tablet Take 25 mg by mouth every 6 (six) hours as needed for nausea or vomiting.    . simvastatin (ZOCOR) 20 MG tablet Take 20 mg by mouth every evening.    . tiotropium (SPIRIVA) 18 MCG inhalation capsule Place 18 mcg into inhaler and inhale daily.    . traZODone (DESYREL) 100 MG tablet Take 200 mg by mouth at bedtime.    . varenicline (CHANTIX) 1 MG tablet Take 1 mg by mouth 2 (two) times daily.     No current facility-administered medications for this visit.     Allergies:   Aspirin; Avelox [moxifloxacin]; Okra; Sulfa antibiotics; Avelox [moxifloxacin hcl in nacl]; and Strawberry extract    Social History:  The patient  reports that she quit smoking about 13 months ago. Her  smoking use included Cigarettes. She has a 12.50 pack-year smoking history. She quit smokeless tobacco use about 4 years ago. Her smokeless tobacco use included Snuff. She reports that she drinks alcohol. She reports that she uses drugs.   Family History:  The patient's family history includes Cancer in her father; Coronary artery disease in her father; Coronary artery disease (age of onset: 71) in her sister; Coronary artery disease (age of onset: 54) in her mother; Heart attack in her mother; Heart failure in her father; Hyperlipidemia in her father and mother; Hypertension in her father and mother; Stroke in her father.    ROS:  Please see the history of present illness.   Otherwise, review of systems are positive for none.   All other systems are reviewed and negative.    PHYSICAL EXAM: VS:  BP 133/78   Pulse 85   Ht 5' 4.5" (1.638 m)   Wt 181 lb (82.1 kg)   BMI 30.59 kg/m  , BMI Body mass index is 30.59 kg/m. GENERAL:  Well appearing HEENT:  Pupils equal round and reactive, fundi not visualized, oral mucosa unremarkable NECK:  No jugular venous distention, waveform within normal limits, carotid upstroke brisk and symmetric, no bruits, no thyromegaly LYMPHATICS:  No cervical adenopathy LUNGS:  Clear to auscultation bilaterally HEART:  RRR.  PMI not displaced or sustained,S1 and S2 within normal limits, no S3, no S4, no clicks, no rubs, I/VI systolic murmur at the LUSB ABD:  Flat, positive bowel sounds normal in frequency in pitch, no bruits, no rebound, no guarding, no midline pulsatile mass, no hepatomegaly, no splenomegaly EXT:  2 plus pulses throughout, no edema, no cyanosis no clubbing SKIN:  No rashes no nodules NEURO:  Cranial nerves II through XII grossly intact, motor grossly intact throughout PSYCH:  Cognitively intact, oriented to person place and time   EKG:  EKG is ordered today. The ekg ordered today demonstrates sinus rhythm. Rate 84 bpm.   Lexiscan Myoview 06/13/16:  Moderate fixed  apical defect.  LVEF 53%  LHC 03/29/13: normal coronary arteries and LVEF 60%.  Recent Labs: 11/02/2015: ALT 16; BUN 17; Creatinine, Ser 0.59; Hemoglobin 10.7; Magnesium 2.1; Platelets 201; Potassium 4.1; Sodium 140 11/04/2015: TSH 0.413    Lipid Panel No results Morse for: CHOL, TRIG, HDL, CHOLHDL, VLDL, LDLCALC, LDLDIRECT    Wt Readings from Last 3 Encounters:  06/28/16 181 lb (82.1 kg)  11/01/15 163 lb (73.9 kg)  12/03/14 175 lb (79.4 kg)      ASSESSMENT AND PLAN:  # Atypcical chest pain: # Abnormal stress test: Ms. Milberg has atypical chest pain and had an abnormal stress test showing a fixed scar in the apical region. The significance of this is unclear. He given that she has continued symptoms she was referred by Dr. Claudie Leach for [facility].  She will undergo LHC with Dr. Burt Knack on 9/15.  Continue aspirin and Imdur.    Risks and benefits of cardiac catheterization have been discussed with the patient.  The patient understands that risks included but are not limited to stroke (1 in 1000), death (1 in 52), kidney failure [usually temporary] (1 in 500), bleeding (1 in 200), allergic reaction [possibly serious] (1 in 200). The patient understands and agrees to proceed.    # Hypertension: BP well-controlled.  Continue lisinopril.  # Hyperlipidemia: Continue simvastatin.    Current medicines are reviewed at length with the patient today.  The patient does not have concerns regarding medicines.  The following changes have been made:  no change  Labs/ tests ordered today include:   Orders Placed This Encounter  Procedures  . CBC with Differential/Platelet  . Basic metabolic panel  . INR/PT     Disposition:   FU with Kato Wieczorek C. Oval Linsey, MD, [facility] in 1 month.  She will then continue to follow up with Dr. Claudie Leach.     This note was written with the assistance of speech recognition software.  Please excuse any transcriptional errors.  Signed, Dossie Ocanas C. Oval Linsey,  MD, [facility]  06/28/2016 1:23 PM    [place]

## 2016-07-01 LAB — CBC WITH DIFFERENTIAL/PLATELET
BASOS PCT: 1 %
Basophils Absolute: 52 cells/uL (ref 0–200)
EOS ABS: 52 {cells}/uL (ref 15–500)
EOS PCT: 1 %
HCT: 36 % (ref 35.0–45.0)
Hemoglobin: 11.6 g/dL — ABNORMAL LOW (ref 11.7–15.5)
Lymphocytes Relative: 34 %
Lymphs Abs: 1768 cells/uL (ref 850–3900)
MCH: 29.5 pg (ref 27.0–33.0)
MCHC: 32.2 g/dL (ref 32.0–36.0)
MCV: 91.6 fL (ref 80.0–100.0)
MONOS PCT: 6 %
MPV: 10.4 fL (ref 7.5–12.5)
Monocytes Absolute: 312 cells/uL (ref 200–950)
NEUTROS ABS: 3016 {cells}/uL (ref 1500–7800)
Neutrophils Relative %: 58 %
PLATELETS: 275 10*3/uL (ref 140–400)
RBC: 3.93 MIL/uL (ref 3.80–5.10)
RDW: 14.3 % (ref 11.0–15.0)
WBC: 5.2 10*3/uL (ref 3.8–10.8)

## 2016-07-01 LAB — BASIC METABOLIC PANEL
BUN: 11 mg/dL (ref 7–25)
CHLORIDE: 99 mmol/L (ref 98–110)
CO2: 33 mmol/L — AB (ref 20–31)
Calcium: 9.1 mg/dL (ref 8.6–10.4)
Creat: 0.7 mg/dL (ref 0.50–1.05)
Glucose, Bld: 78 mg/dL (ref 65–99)
Potassium: 4.1 mmol/L (ref 3.5–5.3)
SODIUM: 139 mmol/L (ref 135–146)

## 2016-07-01 LAB — PROTIME-INR
INR: 0.9
Prothrombin Time: 10 s (ref 9.0–11.5)

## 2016-07-05 ENCOUNTER — Encounter (HOSPITAL_COMMUNITY)
Admission: RE | Disposition: A | Discharge status: Home / Self Care | Payer: Self-pay | Source: Ambulatory Visit | Attending: Cardiovascular Disease

## 2016-07-05 ENCOUNTER — Ambulatory Visit (HOSPITAL_COMMUNITY)
Admission: RE | Admit: 2016-07-05 | Discharge: 2016-07-05 | Disposition: A | Discharge status: Home / Self Care | Payer: Medicare Other | Source: Ambulatory Visit | Attending: Cardiovascular Disease | Admitting: Cardiovascular Disease

## 2016-07-05 DIAGNOSIS — R9439 Abnormal result of other cardiovascular function study: Secondary | ICD-10-CM | POA: Diagnosis present

## 2016-07-05 DIAGNOSIS — I272 Other secondary pulmonary hypertension: Secondary | ICD-10-CM | POA: Insufficient documentation

## 2016-07-05 DIAGNOSIS — M810 Age-related osteoporosis without current pathological fracture: Secondary | ICD-10-CM | POA: Insufficient documentation

## 2016-07-05 DIAGNOSIS — F419 Anxiety disorder, unspecified: Secondary | ICD-10-CM | POA: Diagnosis not present

## 2016-07-05 DIAGNOSIS — I340 Nonrheumatic mitral (valve) insufficiency: Secondary | ICD-10-CM | POA: Diagnosis not present

## 2016-07-05 DIAGNOSIS — R931 Abnormal findings on diagnostic imaging of heart and coronary circulation: Secondary | ICD-10-CM

## 2016-07-05 DIAGNOSIS — E785 Hyperlipidemia, unspecified: Secondary | ICD-10-CM | POA: Diagnosis not present

## 2016-07-05 DIAGNOSIS — R0789 Other chest pain: Secondary | ICD-10-CM | POA: Insufficient documentation

## 2016-07-05 DIAGNOSIS — Z7952 Long term (current) use of systemic steroids: Secondary | ICD-10-CM | POA: Diagnosis not present

## 2016-07-05 DIAGNOSIS — F329 Major depressive disorder, single episode, unspecified: Secondary | ICD-10-CM | POA: Insufficient documentation

## 2016-07-05 DIAGNOSIS — K219 Gastro-esophageal reflux disease without esophagitis: Secondary | ICD-10-CM | POA: Insufficient documentation

## 2016-07-05 DIAGNOSIS — Z7951 Long term (current) use of inhaled steroids: Secondary | ICD-10-CM | POA: Insufficient documentation

## 2016-07-05 DIAGNOSIS — G8929 Other chronic pain: Secondary | ICD-10-CM | POA: Insufficient documentation

## 2016-07-05 DIAGNOSIS — I129 Hypertensive chronic kidney disease with stage 1 through stage 4 chronic kidney disease, or unspecified chronic kidney disease: Secondary | ICD-10-CM | POA: Diagnosis not present

## 2016-07-05 DIAGNOSIS — Z9981 Dependence on supplemental oxygen: Secondary | ICD-10-CM | POA: Insufficient documentation

## 2016-07-05 DIAGNOSIS — G4700 Insomnia, unspecified: Secondary | ICD-10-CM | POA: Insufficient documentation

## 2016-07-05 DIAGNOSIS — Z6830 Body mass index (BMI) 30.0-30.9, adult: Secondary | ICD-10-CM | POA: Diagnosis not present

## 2016-07-05 DIAGNOSIS — D649 Anemia, unspecified: Secondary | ICD-10-CM | POA: Diagnosis not present

## 2016-07-05 DIAGNOSIS — J45909 Unspecified asthma, uncomplicated: Secondary | ICD-10-CM | POA: Diagnosis not present

## 2016-07-05 DIAGNOSIS — J449 Chronic obstructive pulmonary disease, unspecified: Secondary | ICD-10-CM | POA: Insufficient documentation

## 2016-07-05 DIAGNOSIS — Z8719 Personal history of other diseases of the digestive system: Secondary | ICD-10-CM | POA: Insufficient documentation

## 2016-07-05 DIAGNOSIS — N184 Chronic kidney disease, stage 4 (severe): Secondary | ICD-10-CM | POA: Diagnosis not present

## 2016-07-05 DIAGNOSIS — G4730 Sleep apnea, unspecified: Secondary | ICD-10-CM | POA: Diagnosis not present

## 2016-07-05 DIAGNOSIS — E538 Deficiency of other specified B group vitamins: Secondary | ICD-10-CM | POA: Insufficient documentation

## 2016-07-05 DIAGNOSIS — Z823 Family history of stroke: Secondary | ICD-10-CM | POA: Insufficient documentation

## 2016-07-05 DIAGNOSIS — E669 Obesity, unspecified: Secondary | ICD-10-CM | POA: Diagnosis not present

## 2016-07-05 DIAGNOSIS — M1990 Unspecified osteoarthritis, unspecified site: Secondary | ICD-10-CM | POA: Insufficient documentation

## 2016-07-05 DIAGNOSIS — Z7982 Long term (current) use of aspirin: Secondary | ICD-10-CM | POA: Insufficient documentation

## 2016-07-05 DIAGNOSIS — Z87891 Personal history of nicotine dependence: Secondary | ICD-10-CM | POA: Insufficient documentation

## 2016-07-05 DIAGNOSIS — G2581 Restless legs syndrome: Secondary | ICD-10-CM | POA: Diagnosis not present

## 2016-07-05 DIAGNOSIS — Z8249 Family history of ischemic heart disease and other diseases of the circulatory system: Secondary | ICD-10-CM | POA: Insufficient documentation

## 2016-07-05 HISTORY — PX: CARDIAC CATHETERIZATION: SHX172

## 2016-07-05 SURGERY — LEFT HEART CATH AND CORONARY ANGIOGRAPHY
Anesthesia: LOCAL

## 2016-07-05 MED ORDER — VERAPAMIL HCL 2.5 MG/ML IV SOLN
INTRAVENOUS | Status: AC
  Filled 2016-07-05: qty 2

## 2016-07-05 MED ORDER — SODIUM CHLORIDE 0.9% FLUSH: 3.0000 mL | INTRAVENOUS | Status: DC | PRN

## 2016-07-05 MED ORDER — VERAPAMIL HCL 2.5 MG/ML IV SOLN
INTRAVENOUS | Status: DC | PRN
  Administered 2016-07-05: 10 mL via INTRA_ARTERIAL

## 2016-07-05 MED ORDER — SODIUM CHLORIDE 0.9% FLUSH: 3.0000 mL | Freq: Two times a day (BID) | INTRAVENOUS | Status: DC

## 2016-07-05 MED ORDER — FENTANYL CITRATE (PF) 100 MCG/2ML IJ SOLN
INTRAMUSCULAR | Status: DC | PRN
  Administered 2016-07-05: 25 ug via INTRAVENOUS

## 2016-07-05 MED ORDER — HEPARIN SODIUM (PORCINE) 1000 UNIT/ML IJ SOLN
INTRAMUSCULAR | Status: AC
  Filled 2016-07-05: qty 1

## 2016-07-05 MED ORDER — SODIUM CHLORIDE 0.9 % IV SOLN: 250.0000 mL | INTRAVENOUS | Status: DC | PRN

## 2016-07-05 MED ORDER — IOPAMIDOL (ISOVUE-370) INJECTION 76%
INTRAVENOUS | Status: DC | PRN
  Administered 2016-07-05: 55 mL via INTRAVENOUS

## 2016-07-05 MED ORDER — LIDOCAINE HCL (PF) 1 % IJ SOLN
INTRAMUSCULAR | Status: DC | PRN
  Administered 2016-07-05: 2 mL

## 2016-07-05 MED ORDER — HEPARIN SODIUM (PORCINE) 1000 UNIT/ML IJ SOLN
INTRAMUSCULAR | Status: DC | PRN
  Administered 2016-07-05: 4000 [IU] via INTRAVENOUS

## 2016-07-05 MED ORDER — SODIUM CHLORIDE 0.9 % WEIGHT BASED INFUSION
1.0000 mL/kg/h | INTRAVENOUS | Status: DC
Start: 2016-07-05 — End: 2016-07-05

## 2016-07-05 MED ORDER — SODIUM CHLORIDE 0.9 % WEIGHT BASED INFUSION: 1.0000 mL/kg/h | INTRAVENOUS | Status: DC

## 2016-07-05 MED ORDER — SODIUM CHLORIDE 0.9 % WEIGHT BASED INFUSION
3.0000 mL/kg/h | INTRAVENOUS | Status: AC
  Administered 2016-07-05: 3 mL/kg/h via INTRAVENOUS

## 2016-07-05 MED ORDER — FENTANYL CITRATE (PF) 100 MCG/2ML IJ SOLN
INTRAMUSCULAR | Status: AC
  Filled 2016-07-05: qty 2

## 2016-07-05 MED ORDER — MIDAZOLAM HCL 2 MG/2ML IJ SOLN
INTRAMUSCULAR | Status: DC | PRN
  Administered 2016-07-05: 2 mg via INTRAVENOUS

## 2016-07-05 MED ORDER — HEPARIN (PORCINE) IN NACL 2-0.9 UNIT/ML-% IJ SOLN
INTRAMUSCULAR | Status: AC
  Filled 2016-07-05: qty 1000

## 2016-07-05 MED ORDER — IOPAMIDOL (ISOVUE-370) INJECTION 76%
INTRAVENOUS | Status: AC
  Filled 2016-07-05: qty 100

## 2016-07-05 MED ORDER — MIDAZOLAM HCL 2 MG/2ML IJ SOLN
INTRAMUSCULAR | Status: AC
  Filled 2016-07-05: qty 2

## 2016-07-05 MED ORDER — LIDOCAINE HCL (PF) 1 % IJ SOLN
INTRAMUSCULAR | Status: AC
  Filled 2016-07-05: qty 30

## 2016-07-05 MED ORDER — HEPARIN (PORCINE) IN NACL 2-0.9 UNIT/ML-% IJ SOLN
INTRAMUSCULAR | Status: DC | PRN
  Administered 2016-07-05: 1000 mL

## 2016-07-05 SURGICAL SUPPLY — 9 items
CATH INFINITI 5 FR JL3.5 (CATHETERS) ×1 IMPLANT
CATH INFINITI JR4 5F (CATHETERS) ×1 IMPLANT
DEVICE RAD COMP TR BAND LRG (VASCULAR PRODUCTS) ×1 IMPLANT
GLIDESHEATH SLEND SS 6F .021 (SHEATH) ×1 IMPLANT
KIT HEART LEFT (KITS) ×2 IMPLANT
PACK CARDIAC CATHETERIZATION (CUSTOM PROCEDURE TRAY) ×2 IMPLANT
TRANSDUCER W/STOPCOCK (MISCELLANEOUS) ×2 IMPLANT
TUBING CIL FLEX 10 FLL-RA (TUBING) ×2 IMPLANT
WIRE SAFE-T 1.5MM-J .035X260CM (WIRE) ×1 IMPLANT

## 2016-07-05 NOTE — Discharge Instructions (Signed)
Radial Site Care °Refer to this sheet in the next few weeks. These instructions provide you with information about caring for yourself after your procedure. Your health care provider may also give you more specific instructions. Your treatment has been planned according to current medical practices, but problems sometimes occur. Call your health care provider if you have any problems or questions after your procedure. °WHAT TO EXPECT AFTER THE PROCEDURE °After your procedure, it is typical to have the following: °· Bruising at the radial site that usually fades within 1-2 weeks. °· Blood collecting in the tissue (hematoma) that may be painful to the touch. It should usually decrease in size and tenderness within 1-2 weeks. °HOME CARE INSTRUCTIONS °· Take medicines only as directed by your health care provider. °· You may shower 24-48 hours after the procedure or as directed by your health care provider. Remove the bandage (dressing) and gently wash the site with plain soap and water. Pat the area dry with a clean towel. Do not rub the site, because this may cause bleeding. °· Do not take baths, swim, or use a hot tub until your health care provider approves. °· Check your insertion site every day for redness, swelling, or drainage. °· Do not apply powder or lotion to the site. °· Do not flex or bend the affected arm for 24 hours or as directed by your health care provider. °· Do not push or pull heavy objects with the affected arm for 24 hours or as directed by your health care provider. °· Do not lift over 10 lb (4.5 kg) for 5 days after your procedure or as directed by your health care provider. °· Ask your health care provider when it is okay to: °¨ Return to work or school. °¨ Resume usual physical activities or sports. °¨ Resume sexual activity. °· Do not drive home if you are discharged the same day as the procedure. Have someone else drive you. °· You may drive 24 hours after the procedure unless otherwise  instructed by your health care provider. °· Do not operate machinery or power tools for 24 hours after the procedure. °· If your procedure was done as an outpatient procedure, which means that you went home the same day as your procedure, a responsible adult should be with you for the first 24 hours after you arrive home. °· Keep all follow-up visits as directed by your health care provider. This is important. °SEEK MEDICAL CARE IF: °· You have a fever. °· You have chills. °· You have increased bleeding from the radial site. Hold pressure on the site. °SEEK IMMEDIATE MEDICAL CARE IF: °· You have unusual pain at the radial site. °· You have redness, warmth, or swelling at the radial site. °· You have drainage (other than a small amount of blood on the dressing) from the radial site. °· The radial site is bleeding, and the bleeding does not stop after 30 minutes of holding steady pressure on the site. °· Your arm or hand becomes pale, cool, tingly, or numb. °  °This information is not intended to replace advice given to you by your health care provider. Make sure you discuss any questions you have with your health care provider. °  °Document Released: 11/09/2010 Document Revised: 10/28/2014 Document Reviewed: 04/25/2014 °Elsevier Interactive Patient Education ©2016 [employer] ° °

## 2016-07-05 NOTE — Interval H&P Note (Signed)
Cath Lab Visit (complete for each Cath Lab visit)  Clinical Evaluation Leading to the Procedure:   ACS: No.  Non-ACS:    Anginal Classification: CCS II  Anti-ischemic medical therapy: Minimal Therapy (1 class of medications)  Non-Invasive Test Results: Low-risk stress test findings: cardiac mortality <1%/year  Prior CABG: No previous CABG      History and Physical Interval Note:  07/05/2016 11:09 AM  Shannon Morse  has presented today for surgery, with the diagnosis of Abnormal Stress Test  The various methods of treatment have been discussed with the patient and family. After consideration of risks, benefits and other options for treatment, the patient has consented to  Procedure(s): Left Heart Cath and Coronary Angiography (N/A) as a surgical intervention .  The patient's history has been reviewed, patient examined, no change in status, stable for surgery.  I have reviewed the patient's chart and labs.  Questions were answered to the patient's satisfaction.     Larae Grooms

## 2016-07-05 NOTE — H&P (View-Only) (Signed)
Cardiology Office Note   Date:  06/28/2016   ID:  Shannon Morse, DOB 11/27/1955, MRN [id number]  PCP:  Shannon Pont, MD  Cardiologist:   Skeet Latch, MD  Referring cardiologist: Dr. Lawson Radar  Chief Complaint  Patient presents with  . Follow-up    sob; when sitting, increases when patient is active. edema; in feet and legs.     History of Present Illness: Shannon Morse is a 60 y.o. female with hypertension, severe pulmonary hypertension, asthma, meningioma s/p removal 2015, and moderate mitral regurgitation who presents for evaluation of an abnormal stress test.  [name] Was seen at [facility] 05/2016 with shortness of breath, dyspnea on exertion and fatigue.  She notes episodes of chest pressure that lasted up to one hour at a time. It occurs both at rest and with exertion. She rates it as 5 out of 10 in severity and is associated with increased shortness of breath. She denies nausea but does have diaphoresis and dizziness when it occurs. She does not get any formal exercise but notes that it does sometimes happen when she is walking. She has not noted any lower extremity edema, orthopnea, or PND.  Records from her hospitalization are not currently available. However, she reportedly did not have a heart attack and was told to follow-up with cardiology as an outpatient.  She saw Dr. Lawson Radar of [facility] in [facility].   She had a Lexiscan Myoview on 06/13/16 that revealed a moderate apical defect that was not reversible. LVEF was 53%.  Given that she was having continued chest discomfort Dr. Claudie Leach referred [facility] to [facility].  He also started her on lisinopril and Imdur, as her blood pressure was above goal at that visit. [name] had a LHC with Dr. Gwenlyn Found 03/29/13 that revealed normal coronary arteries and LVEF 60%.  [name] quit smoking one month ago.  She has been using Chantix with success.  She has also been on prednisone  for a COPD exacerbation.  She reports gaining 10 lb since being on prednisone. Gaining weight 2/2 prednisone.   Past Medical History:  Diagnosis Date  . Abnormal stress test 06/28/2016  . Allergic rhinitis   . Anemia   . Anginal pain ([place])    with normal coronaries by cath 03/2013  . Anxiety   . Arthritis    "all over my body"  . Asthma   . Barrett's esophagus   . Bruises easily   . Chronic bronchitis ([place]) "3-4 times/year"  . Chronic pain    "hips, neck, back, head" (11/01/2015"  . CKD (chronic kidney disease), stage IV ([place])    pt has never been told this; does not see kidney doctor (11/01/2015)  . Constipation due to pain medication   . COPD (chronic obstructive pulmonary disease) ([place])   . Coronary artery disease    normal coronaries by 03/2013 cath  . Daily headache   . DDD (degenerative disc disease)   . Depression   . DJD (degenerative joint disease)   . Essential hypertension 06/28/2016  . Essential hypertension 06/28/2016  . Gastritis   . GERD (gastroesophageal reflux disease)   . HSV (herpes simplex virus) infection   . Insomnia   . Mitral regurgitation    mild to moderate by echo 06/2013  . Neuromuscular disorder ([place])   . Obesity   . On home oxygen therapy    "2L; 24/7" (11/01/2015)  . Osteoporosis   .  Osteoporosis   . Pneumonia "several times"  . Restless leg syndrome   . Seasonal allergies   . Shortness of breath    with exertion; cannot lay flat  . Sleep apnea    does not wear machine  (11/01/2015)  . Tobacco abuse   . Vitamin B12 deficiency     Past Surgical History:  Procedure Laterality Date  . ABDOMINAL HYSTERECTOMY  1986   "partial"  . ANKLE FRACTURE SURGERY Left 2010  . ANTERIOR CERVICAL DECOMP/DISCECTOMY FUSION  2010   ; C5-C6 anterior discectomy with fusion  . ANTERIOR CERVICAL DECOMP/DISCECTOMY FUSION N/A 09/28/2013   Procedure: CERVICAL FOUR-FIVE, CERVICAL SIX-SEVEN ANTERIOR CERVICAL DECOMPRESSION/DISCECTOMY FUSION 2 LEVELS;  Surgeon: Faythe Ghee, MD;  Location: [place] NEURO ORS;  Service: Neurosurgery;  Laterality: N/A;  . BACK SURGERY    . BRAIN SURGERY    . CARDIAC CATHETERIZATION  2009  . CARPAL TUNNEL RELEASE Bilateral   . CATARACT EXTRACTION W/ INTRAOCULAR LENS  IMPLANT, BILATERAL Bilateral   . COLONOSCOPY    . CRANIOTOMY Right 09/08/2014   Procedure: Right Frontal Craniotomy w/Curve;  Surgeon: Faythe Ghee, MD;  Location: [place] NEURO ORS;  Service: Neurosurgery;  Laterality: Right;  Right Frontal Craniotomy w/Curve  . CYSTOSCOPY VAGINOSCOPY W/ VAGINAL DILATION    . ESOPHAGOGASTRODUODENOSCOPY (EGD) WITH ESOPHAGEAL DILATION  "several times"  . ESOPHAGOGASTRODUODENOSCOPY ENDOSCOPY    . FRACTURE SURGERY    . LAPAROSCOPIC CHOLECYSTECTOMY    . LEFT HEART CATHETERIZATION WITH CORONARY ANGIOGRAM N/A 03/29/2013   Procedure: LEFT HEART CATHETERIZATION WITH CORONARY ANGIOGRAM;  Surgeon: Lorretta Harp, MD;  Location: [facility] CATH LAB;  Service: Cardiovascular;  Laterality: N/A;  . TEE WITHOUT CARDIOVERSION N/A 07/19/2013   Procedure: TRANSESOPHAGEAL ECHOCARDIOGRAM (TEE);  Surgeon: Sanda Klein, MD;  Location: [facility] ENDOSCOPY;  Service: Cardiovascular;  Laterality: N/A;  . TONSILLECTOMY       Current Outpatient Prescriptions  Medication Sig Dispense Refill  . albuterol (PROVENTIL HFA;VENTOLIN HFA) 108 (90 BASE) MCG/ACT inhaler Inhale 2 puffs into the lungs every 6 (six) hours as needed for wheezing or shortness of breath.    Marland Kitchen albuterol (PROVENTIL) (2.5 MG/3ML) 0.083% nebulizer solution Take 2.5 mg by nebulization every 6 (six) hours as needed for wheezing or shortness of breath.    . ALPRAZolam (XANAX) 0.5 MG tablet Take 0.5 mg by mouth 3 (three) times daily.  1  . aspirin EC 81 MG tablet Take 81 mg by mouth every other day.     . budesonide-formoterol (SYMBICORT) 160-4.5 MCG/ACT inhaler Inhale 2 puffs into the lungs 2 (two) times daily.    . busPIRone (BUSPAR) 15 MG tablet Take 30 mg by mouth 2 (two) times daily.     . cefUROXime  (CEFTIN) 500 MG tablet Take 1 tablet (500 mg total) by mouth 2 (two) times daily with a meal. 6 tablet 0  . cyclobenzaprine (FLEXERIL) 10 MG tablet Take 10 mg by mouth at bedtime.    . fluticasone (FLONASE) 50 MCG/ACT nasal spray Place 1 spray into both nostrils 2 (two) times daily.    Marland Kitchen gabapentin (NEURONTIN) 400 MG capsule Take 400 mg by mouth at bedtime.    . isosorbide dinitrate (ISORDIL) 30 MG tablet Take 30 mg by mouth 2 (two) times daily.    . Linaclotide (LINZESS) 145 MCG CAPS capsule Take 145 mcg by mouth daily.    Marland Kitchen lisinopril (PRINIVIL,ZESTRIL) 2.5 MG tablet Take 2.5 mg by mouth daily.    Marland Kitchen loratadine (CLARITIN) 10 MG tablet Take 10  mg by mouth daily.     . montelukast (SINGULAIR) 10 MG tablet Take 10 mg by mouth every morning.    Marland Kitchen oxyCODONE (ROXICODONE) 15 MG immediate release tablet Take 1 tablet (15 mg total) by mouth 3 (three) times daily as needed for pain.    . pantoprazole (PROTONIX) 40 MG tablet Take 40 mg by mouth 2 (two) times daily.  12  . pramipexole (MIRAPEX) 1 MG tablet Take 1 mg by mouth at bedtime.    . predniSONE (DELTASONE) 10 MG tablet Take 5 tablets (50 mg total) by mouth daily with breakfast. Taper down by 10 mg every 2 days to discontinue. 30 tablet 0  . promethazine (PHENERGAN) 25 MG tablet Take 25 mg by mouth every 6 (six) hours as needed for nausea or vomiting.    . simvastatin (ZOCOR) 20 MG tablet Take 20 mg by mouth every evening.    . tiotropium (SPIRIVA) 18 MCG inhalation capsule Place 18 mcg into inhaler and inhale daily.    . traZODone (DESYREL) 100 MG tablet Take 200 mg by mouth at bedtime.    . varenicline (CHANTIX) 1 MG tablet Take 1 mg by mouth 2 (two) times daily.     No current facility-administered medications for this visit.     Allergies:   Aspirin; Avelox [moxifloxacin]; Okra; Sulfa antibiotics; Avelox [moxifloxacin hcl in nacl]; and Strawberry extract    Social History:  The patient  reports that she quit smoking about 13 months ago. Her  smoking use included Cigarettes. She has a 12.50 pack-year smoking history. She quit smokeless tobacco use about 4 years ago. Her smokeless tobacco use included Snuff. She reports that she drinks alcohol. She reports that she uses drugs.   Family History:  The patient's family history includes Cancer in her father; Coronary artery disease in her father; Coronary artery disease (age of onset: 36) in her sister; Coronary artery disease (age of onset: 29) in her mother; Heart attack in her mother; Heart failure in her father; Hyperlipidemia in her father and mother; Hypertension in her father and mother; Stroke in her father.    ROS:  Please see the history of present illness.   Otherwise, review of systems are positive for none.   All other systems are reviewed and negative.    PHYSICAL EXAM: VS:  BP 133/78   Pulse 85   Ht 5' 4.5" (1.638 m)   Wt 181 lb (82.1 kg)   BMI 30.59 kg/m  , BMI Body mass index is 30.59 kg/m. GENERAL:  Well appearing HEENT:  Pupils equal round and reactive, fundi not visualized, oral mucosa unremarkable NECK:  No jugular venous distention, waveform within normal limits, carotid upstroke brisk and symmetric, no bruits, no thyromegaly LYMPHATICS:  No cervical adenopathy LUNGS:  Clear to auscultation bilaterally HEART:  RRR.  PMI not displaced or sustained,S1 and S2 within normal limits, no S3, no S4, no clicks, no rubs, I/VI systolic murmur at the LUSB ABD:  Flat, positive bowel sounds normal in frequency in pitch, no bruits, no rebound, no guarding, no midline pulsatile mass, no hepatomegaly, no splenomegaly EXT:  2 plus pulses throughout, no edema, no cyanosis no clubbing SKIN:  No rashes no nodules NEURO:  Cranial nerves II through XII grossly intact, motor grossly intact throughout PSYCH:  Cognitively intact, oriented to person place and time   EKG:  EKG is ordered today. The ekg ordered today demonstrates sinus rhythm. Rate 84 bpm.   Lexiscan Myoview 06/13/16:  Moderate fixed  apical defect.  LVEF 53%  LHC 03/29/13: normal coronary arteries and LVEF 60%.  Recent Labs: 11/02/2015: ALT 16; BUN 17; Creatinine, Ser 0.59; Hemoglobin 10.7; Magnesium 2.1; Platelets 201; Potassium 4.1; Sodium 140 11/04/2015: TSH 0.413    Lipid Panel No results found for: CHOL, TRIG, HDL, CHOLHDL, VLDL, LDLCALC, LDLDIRECT    Wt Readings from Last 3 Encounters:  06/28/16 181 lb (82.1 kg)  11/01/15 163 lb (73.9 kg)  12/03/14 175 lb (79.4 kg)      ASSESSMENT AND PLAN:  # Atypcical chest pain: # Abnormal stress test: Ms. Gaspar has atypical chest pain and had an abnormal stress test showing a fixed scar in the apical region. The significance of this is unclear. He given that she has continued symptoms she was referred by Dr. Claudie Leach for [facility].  She will undergo LHC with Dr. Burt Knack on 9/15.  Continue aspirin and Imdur.    Risks and benefits of cardiac catheterization have been discussed with the patient.  The patient understands that risks included but are not limited to stroke (1 in 1000), death (1 in 5), kidney failure [usually temporary] (1 in 500), bleeding (1 in 200), allergic reaction [possibly serious] (1 in 200). The patient understands and agrees to proceed.    # Hypertension: BP well-controlled.  Continue lisinopril.  # Hyperlipidemia: Continue simvastatin.    Current medicines are reviewed at length with the patient today.  The patient does not have concerns regarding medicines.  The following changes have been made:  no change  Labs/ tests ordered today include:   Orders Placed This Encounter  Procedures  . CBC with Differential/Platelet  . Basic metabolic panel  . INR/PT     Disposition:   FU with Kairi Harshbarger C. Oval Linsey, MD, [facility] in 1 month.  She will then continue to follow up with Dr. Claudie Leach.     This note was written with the assistance of speech recognition software.  Please excuse any transcriptional errors.  Signed, Demorris Choyce C. Oval Linsey,  MD, [facility]  06/28/2016 1:23 PM    [place]

## 2016-07-08 ENCOUNTER — Encounter (HOSPITAL_COMMUNITY): Payer: Self-pay | Admitting: Interventional Cardiology

## 2016-08-01 ENCOUNTER — Encounter: Payer: Self-pay | Admitting: *Deleted

## 2016-08-01 ENCOUNTER — Ambulatory Visit: Payer: Medicare Other | Admitting: Cardiovascular Disease

## 2017-03-05 ENCOUNTER — Encounter (HOSPITAL_BASED_OUTPATIENT_CLINIC_OR_DEPARTMENT_OTHER): Payer: Self-pay | Admitting: Emergency Medicine

## 2017-03-05 ENCOUNTER — Emergency Department (HOSPITAL_BASED_OUTPATIENT_CLINIC_OR_DEPARTMENT_OTHER)
Admission: EM | Admit: 2017-03-05 | Discharge: 2017-03-05 | Disposition: A | Discharge status: Home / Self Care | Payer: Medicare Other | Attending: Emergency Medicine | Admitting: Emergency Medicine

## 2017-03-05 ENCOUNTER — Emergency Department (HOSPITAL_BASED_OUTPATIENT_CLINIC_OR_DEPARTMENT_OTHER): Payer: Medicare Other

## 2017-03-05 DIAGNOSIS — Z79891 Long term (current) use of opiate analgesic: Secondary | ICD-10-CM | POA: Insufficient documentation

## 2017-03-05 DIAGNOSIS — Z7982 Long term (current) use of aspirin: Secondary | ICD-10-CM | POA: Insufficient documentation

## 2017-03-05 DIAGNOSIS — G43819 Other migraine, intractable, without status migrainosus: Secondary | ICD-10-CM | POA: Diagnosis not present

## 2017-03-05 DIAGNOSIS — G8929 Other chronic pain: Secondary | ICD-10-CM | POA: Diagnosis not present

## 2017-03-05 DIAGNOSIS — E1122 Type 2 diabetes mellitus with diabetic chronic kidney disease: Secondary | ICD-10-CM | POA: Insufficient documentation

## 2017-03-05 DIAGNOSIS — J449 Chronic obstructive pulmonary disease, unspecified: Secondary | ICD-10-CM | POA: Insufficient documentation

## 2017-03-05 DIAGNOSIS — J45909 Unspecified asthma, uncomplicated: Secondary | ICD-10-CM | POA: Insufficient documentation

## 2017-03-05 DIAGNOSIS — R110 Nausea: Secondary | ICD-10-CM | POA: Diagnosis not present

## 2017-03-05 DIAGNOSIS — N184 Chronic kidney disease, stage 4 (severe): Secondary | ICD-10-CM | POA: Diagnosis not present

## 2017-03-05 DIAGNOSIS — I2510 Atherosclerotic heart disease of native coronary artery without angina pectoris: Secondary | ICD-10-CM | POA: Insufficient documentation

## 2017-03-05 DIAGNOSIS — Z87891 Personal history of nicotine dependence: Secondary | ICD-10-CM | POA: Diagnosis not present

## 2017-03-05 DIAGNOSIS — I129 Hypertensive chronic kidney disease with stage 1 through stage 4 chronic kidney disease, or unspecified chronic kidney disease: Secondary | ICD-10-CM | POA: Diagnosis not present

## 2017-03-05 DIAGNOSIS — Z79899 Other long term (current) drug therapy: Secondary | ICD-10-CM | POA: Insufficient documentation

## 2017-03-05 DIAGNOSIS — R51 Headache: Secondary | ICD-10-CM | POA: Diagnosis present

## 2017-03-05 MED ORDER — PROCHLORPERAZINE EDISYLATE 5 MG/ML IJ SOLN
10.0000 mg | Freq: Once | INTRAMUSCULAR | Status: AC
  Administered 2017-03-05: 10 mg via INTRAVENOUS
  Filled 2017-03-05: qty 2

## 2017-03-05 MED ORDER — DIPHENHYDRAMINE HCL 50 MG/ML IJ SOLN
12.5000 mg | Freq: Once | INTRAMUSCULAR | Status: AC
  Administered 2017-03-05: 12.5 mg via INTRAVENOUS
  Filled 2017-03-05: qty 1

## 2017-03-05 MED ORDER — VALPROATE SODIUM 500 MG/5ML IV SOLN
500.0000 mg | Freq: Once | INTRAVENOUS | Status: AC
  Administered 2017-03-05: 500 mg via INTRAVENOUS
  Filled 2017-03-05: qty 5

## 2017-03-05 MED ORDER — VALPROATE SODIUM 500 MG/5ML IV SOLN
INTRAVENOUS | Status: AC
  Filled 2017-03-05: qty 5

## 2017-03-05 NOTE — ED Triage Notes (Signed)
Pt c/o frontal headache for past 2 weeks off and on.  Pt seen by pain clinic and has been trying to taper her narcotic consumption.  Pt reports taking 24 ibuprofen tablets in the past 24 hours.  Educated patient on proper dosing of ibuprofen.  Pt reports pain medications taken at home have not relieved headache, + photosensitivity and +nausea with palpation to frontal upper skull.  Pt h/o brain surgery.

## 2017-03-05 NOTE — ED Provider Notes (Signed)
[facility] Provider Note   CSN: [id number] Arrival date & time: 03/05/17  1000     History   Chief Complaint Chief Complaint  Patient presents with  . Headache    HPI Shannon Morse is a 61 y.o. female with a past medical history of COPD on home O2, HTN, chronic pain, meningioma s/p right frontal craniotomy 2015 who presents to the ED today complaining of headache. Patient states that yesterday around 4 PM she developed the severe, constant frontal headache. She has associated photosensitivity and nausea. Patient also reports tingling in the left side of her face and feeling off balance. Patient states this is not typical of her regular migraines. She usually gets pain in the back of her head related to her cervical spine surgeries. She has not had pain in this location since her surgery in 2015. She denies any vision changes. Patient has tried taking over 24 ibuprofen since the onset of her migraine without relief. She is also try taking her home narcotics that she receives from a pain clinic without relief of her symptoms.  HPI  Past Medical History:  Diagnosis Date  . Abnormal stress test 06/28/2016  . Allergic rhinitis   . Anemia   . Anginal pain ([place])    with normal coronaries by cath 03/2013  . Anxiety   . Arthritis    "all over my body"  . Asthma   . Barrett's esophagus   . Bruises easily   . Chronic bronchitis ([place]) "3-4 times/year"  . Chronic pain    "hips, neck, back, head" (11/01/2015"  . CKD (chronic kidney disease), stage IV ([place])    pt has never been told this; does not see kidney doctor (11/01/2015)  . Constipation due to pain medication   . COPD (chronic obstructive pulmonary disease) ([place])   . Coronary artery disease    normal coronaries by 03/2013 cath  . Daily headache   . DDD (degenerative disc disease)   . Depression   . DJD (degenerative joint disease)   . Essential hypertension 06/28/2016  . Essential hypertension 06/28/2016  . Gastritis     . GERD (gastroesophageal reflux disease)   . HSV (herpes simplex virus) infection   . Insomnia   . Mitral regurgitation    mild to moderate by echo 06/2013  . Neuromuscular disorder ([place])   . Obesity   . On home oxygen therapy    "2L; 24/7" (11/01/2015)  . Osteoporosis   . Osteoporosis   . Pneumonia "several times"  . Restless leg syndrome   . Seasonal allergies   . Shortness of breath    with exertion; cannot lay flat  . Sleep apnea    does not wear machine  (11/01/2015)  . Tobacco abuse   . Vitamin B12 deficiency     Patient Active Problem List   Diagnosis Date Noted  . Abnormal nuclear stress test 06/28/2016  . Essential hypertension 06/28/2016  . COPD (chronic obstructive pulmonary disease) ([place]) 11/01/2015  . Acute exacerbation of COPD with asthma ([place]) 11/01/2015  . COPD exacerbation ([place])   . Cerebral meningioma ([place]) 09/08/2014  . Cervical spondylosis with radiculopathy 09/28/2013  . Unstable angina ([place]) 03/29/2013  . DM (diabetes mellitus) ([place]) 03/29/2013  . Obesity 03/29/2013  . History of tobacco abuse 03/29/2013  . Family history of early CAD 03/29/2013    Past Surgical History:  Procedure Laterality Date  . ABDOMINAL HYSTERECTOMY  1986   "partial"  . ANKLE FRACTURE SURGERY Left  2010  . ANTERIOR CERVICAL DECOMP/DISCECTOMY FUSION  2010   ; C5-C6 anterior discectomy with fusion  . ANTERIOR CERVICAL DECOMP/DISCECTOMY FUSION N/A 09/28/2013   Procedure: CERVICAL FOUR-FIVE, CERVICAL SIX-SEVEN ANTERIOR CERVICAL DECOMPRESSION/DISCECTOMY FUSION 2 LEVELS;  Surgeon: Faythe Ghee, MD;  Location: [facility];  Service: Neurosurgery;  Laterality: N/A;  . BACK SURGERY    . BRAIN SURGERY    . CARDIAC CATHETERIZATION  2009  . CARDIAC CATHETERIZATION N/A 07/05/2016   Procedure: Left Heart Cath and Coronary Angiography;  Surgeon: Jettie Booze, MD;  Location: [facility];  Service: Cardiovascular;  Laterality: N/A;  . CARPAL TUNNEL RELEASE Bilateral   .  CATARACT EXTRACTION W/ INTRAOCULAR LENS  IMPLANT, BILATERAL Bilateral   . COLONOSCOPY    . CRANIOTOMY Right 09/08/2014   Procedure: Right Frontal Craniotomy w/Curve;  Surgeon: Faythe Ghee, MD;  Location: [facility];  Service: Neurosurgery;  Laterality: Right;  Right Frontal Craniotomy w/Curve  . CYSTOSCOPY VAGINOSCOPY W/ VAGINAL DILATION    . ESOPHAGOGASTRODUODENOSCOPY (EGD) WITH ESOPHAGEAL DILATION  "several times"  . ESOPHAGOGASTRODUODENOSCOPY ENDOSCOPY    . FRACTURE SURGERY    . LAPAROSCOPIC CHOLECYSTECTOMY    . LEFT HEART CATHETERIZATION WITH CORONARY ANGIOGRAM N/A 03/29/2013   Procedure: LEFT HEART CATHETERIZATION WITH CORONARY ANGIOGRAM;  Surgeon: Lorretta Harp, MD;  Location: [facility] CATH LAB;  Service: Cardiovascular;  Laterality: N/A;  . TEE WITHOUT CARDIOVERSION N/A 07/19/2013   Procedure: TRANSESOPHAGEAL ECHOCARDIOGRAM (TEE);  Surgeon: Sanda Klein, MD;  Location: [facility] ENDOSCOPY;  Service: Cardiovascular;  Laterality: N/A;  . TONSILLECTOMY      OB History    No data available       Home Medications    Prior to Admission medications   Medication Sig Start Date End Date Taking? Authorizing Provider  albuterol (PROVENTIL HFA;VENTOLIN HFA) 108 (90 BASE) MCG/ACT inhaler Inhale 2 puffs into the lungs every 6 (six) hours as needed for wheezing or shortness of breath.   Yes [provider]  albuterol (PROVENTIL) (2.5 MG/3ML) 0.083% nebulizer solution Take 2.5 mg by nebulization every 4 (four) hours as needed for wheezing or shortness of breath.    Yes [provider]  aspirin EC 81 MG tablet Take 81 mg by mouth daily.    Yes [provider]  budesonide-formoterol (SYMBICORT) 160-4.5 MCG/ACT inhaler Inhale 2 puffs into the lungs 2 (two) times daily.   Yes [provider]  cyclobenzaprine (FLEXERIL) 10 MG tablet Take 10 mg by mouth at bedtime.   Yes [provider]  fluticasone (FLONASE) 50 MCG/ACT nasal spray Place 1 spray into both  nostrils daily as needed for allergies.    Yes [provider]  gabapentin (NEURONTIN) 400 MG capsule Take 400 mg by mouth at bedtime.   Yes [provider]  Linaclotide (LINZESS) 145 MCG CAPS capsule Take 145 mcg by mouth daily as needed (constipation).    Yes [provider]  lisinopril (PRINIVIL,ZESTRIL) 2.5 MG tablet Take 2.5 mg by mouth daily.   Yes [provider]  loratadine (CLARITIN) 10 MG tablet Take 10 mg by mouth daily.    Yes [provider]  montelukast (SINGULAIR) 10 MG tablet Take 10 mg by mouth every morning.   Yes [provider]  oxyCODONE (ROXICODONE) 15 MG immediate release tablet Take 1 tablet (15 mg total) by mouth 3 (three) times daily as needed for pain. Patient taking differently: Take 10 mg by mouth 3 (three) times daily as needed for pain.  11/05/15  Yes Hongalgi, Lenis Dickinson, MD  pantoprazole (PROTONIX) 40 MG tablet Take 40 mg by mouth 2 (two) times daily. 09/30/15  Yes [provider]  pramipexole (MIRAPEX) 1 MG tablet Take 1 mg by mouth at bedtime.   Yes [provider]  promethazine (PHENERGAN) 25 MG tablet Take 25 mg by mouth every 6 (six) hours as needed for nausea or vomiting.   Yes [provider]  simvastatin (ZOCOR) 20 MG tablet Take 20 mg by mouth every evening.   Yes [provider]  varenicline (CHANTIX) 1 MG tablet Take 1 mg by mouth 2 (two) times daily.   Yes [provider]  busPIRone (BUSPAR) 15 MG tablet Take 30 mg by mouth 2 (two) times daily.     [provider]  cefUROXime (CEFTIN) 500 MG tablet Take 1 tablet (500 mg total) by mouth 2 (two) times daily with a meal. Patient not taking: Reported on 07/01/2016 11/05/15   Modena Jansky, MD  citalopram (CELEXA) 20 MG tablet Take 20 mg by mouth daily.    [provider]  oxyCODONE-acetaminophen (PERCOCET) 10-325 MG tablet Take 1 tablet by mouth every 6 (six) hours as needed for pain.     [provider]  predniSONE (DELTASONE) 10 MG tablet Take 5 tablets (50 mg total) by mouth daily with breakfast. Taper down by 10 mg every 2 days to discontinue. Patient not taking: Reported on 07/01/2016 11/05/15   Modena Jansky, MD  traZODone (DESYREL) 100 MG tablet Take 200 mg by mouth at bedtime.    [provider]    Family History Family History  Problem Relation Age of Onset  . Coronary artery disease Mother 19       MI  . Heart attack Mother   . Hypertension Mother   . Hyperlipidemia Mother   . Coronary artery disease Father        MI  . Heart failure Father   . Stroke Father   . Hypertension Father   . Hyperlipidemia Father   . Cancer Father   . Coronary artery disease Sister 68       MI    Social History Social History  Substance Use Topics  . Smoking status: Former Smoker    Packs/day: 0.25    Years: 50.00    Types: Cigarettes    Quit date: 05/05/2015  . Smokeless tobacco: Former Systems developer    Types: Snuff    Quit date: 05/21/2012     Comment: 11/01/2015 "I have smoked up to 3-4 packs/day"  . Alcohol use 1.2 oz/week    2 Standard drinks or equivalent per week     Comment: occ.     Allergies   Aspirin; Avelox [moxifloxacin]; Okra; Sulfa antibiotics; and Strawberry extract   Review of Systems Review of Systems  All other systems reviewed and are negative.    Physical Exam Updated Vital Signs BP 136/68 (BP Location: Left Arm)   Pulse 97   Temp 98.3 F (36.8 C) (Oral)   Resp (!) 22   Ht 5\' 4"  (1.626 m)   Wt 86.6 kg   SpO2 97%   BMI 32.79 kg/m   Physical Exam  Constitutional: She is oriented to person, place, and time. She appears well-developed and well-nourished. No distress.  HENT:  Head: Normocephalic and atraumatic.  Mouth/Throat: No oropharyngeal exudate.  Eyes: Conjunctivae and EOM are normal. Pupils are equal, round, and reactive to light. Right eye exhibits no discharge. Left eye exhibits no discharge.  No scleral icterus.   Cardiovascular: Normal rate, regular rhythm, normal heart sounds and intact distal pulses.  Exam reveals no gallop and no friction rub.   No murmur heard. Pulmonary/Chest: Effort normal and breath sounds normal. No respiratory distress. She has no wheezes. She has no rales. She exhibits no tenderness.  Abdominal: Soft. She exhibits no distension. There is no tenderness. There is no guarding.  Musculoskeletal: Normal range of motion. She exhibits no edema.  Neurological: She is alert and oriented to person, place, and time. She displays normal reflexes. She exhibits normal muscle tone. Coordination normal.  Strength 5/5 throughout.  No gait abnormality. No dysmetria. No slurred speech. No facial droop. Negative pronator drift. During trigeminal nerve exam, decreased sensation on left side of face. No other cranial nerve abnormality.   Skin: Skin is warm and dry. No rash noted. She is not diaphoretic. No erythema. No pallor.  Psychiatric: She has a normal mood and affect. Her behavior is normal.  Nursing note and vitals reviewed.    ED Treatments / Results  Labs (all labs ordered are listed, but only abnormal results are displayed) Labs Reviewed - No data to display  EKG  EKG Interpretation None       Radiology No results found.  Procedures Procedures (including critical care time)  Medications Ordered in ED Medications  prochlorperazine (COMPAZINE) injection 10 mg (not administered)  diphenhydrAMINE (BENADRYL) injection 12.5 mg (not administered)  valproate (DEPACON) 500 mg in dextrose 5 % 50 mL IVPB (not administered)  valproate (DEPACON) 500 MG/5ML injection (not administered)     Initial Impression / Assessment and Plan / ED Course  I have reviewed the triage vital signs and the nursing notes.  Pertinent labs & imaging results that were available during my care of the patient were reviewed by me and considered in my medical decision making (see chart for details).      61-year-old female with a history of right frontal craniotomies due to meningioma presented to the ED today complaining of a frontal migraine onset yesterday. Patient is afebrile, no neck stiffness to suggest meningitis. On exam, she does report decreased sensation of the left side of her face when examining the trigeminal nerve. No other cranial nerve deficits or neurological deficits on exam. No gait abnormality. However, given history and reports that this headache is different from her typical migraines will CT her head.  CT head negative for any acute abnormality, normal postoperative change. Patient was treated in the ED for her headache with Compazine, Depakote and Benadryl with significant relief of her symptoms. Patient was discharged home in stable condition. Return precautions outlined in patient discharge instructions.  Case discussed with Dr. Jeneen Rinks who agreed with treatment plan.   Final Clinical Impressions(s) / ED Diagnoses   Final diagnoses:  Other migraine without status migrainosus, intractable    New Prescriptions New Prescriptions   No medications on file     Carlos Levering, PA-C 03/05/17 1714    Tanna Furry, MD 03/07/17 1623

## 2017-03-05 NOTE — Discharge Instructions (Signed)
You were seen today for a complex migraine. Your CT did not show any acute abnormality. Please follow up with your primary care provider for further evaluation. Return to the ED if you experience severe worsening of your symptoms, change in your vision, weakness in an extremity, vomiting.

## 2017-04-08 ENCOUNTER — Emergency Department (HOSPITAL_BASED_OUTPATIENT_CLINIC_OR_DEPARTMENT_OTHER): Payer: Medicare Other

## 2017-04-08 ENCOUNTER — Emergency Department (HOSPITAL_BASED_OUTPATIENT_CLINIC_OR_DEPARTMENT_OTHER)
Admission: EM | Admit: 2017-04-08 | Discharge: 2017-04-08 | Disposition: A | Discharge status: Home / Self Care | Payer: Medicare Other | Attending: Emergency Medicine | Admitting: Emergency Medicine

## 2017-04-08 ENCOUNTER — Encounter (HOSPITAL_BASED_OUTPATIENT_CLINIC_OR_DEPARTMENT_OTHER): Payer: Self-pay | Admitting: *Deleted

## 2017-04-08 DIAGNOSIS — J449 Chronic obstructive pulmonary disease, unspecified: Secondary | ICD-10-CM | POA: Insufficient documentation

## 2017-04-08 DIAGNOSIS — Z7982 Long term (current) use of aspirin: Secondary | ICD-10-CM | POA: Diagnosis not present

## 2017-04-08 DIAGNOSIS — M79604 Pain in right leg: Secondary | ICD-10-CM | POA: Diagnosis not present

## 2017-04-08 DIAGNOSIS — N184 Chronic kidney disease, stage 4 (severe): Secondary | ICD-10-CM | POA: Diagnosis not present

## 2017-04-08 DIAGNOSIS — I2510 Atherosclerotic heart disease of native coronary artery without angina pectoris: Secondary | ICD-10-CM | POA: Insufficient documentation

## 2017-04-08 DIAGNOSIS — W19XXXA Unspecified fall, initial encounter: Secondary | ICD-10-CM

## 2017-04-08 DIAGNOSIS — G8929 Other chronic pain: Secondary | ICD-10-CM | POA: Diagnosis not present

## 2017-04-08 DIAGNOSIS — Z87891 Personal history of nicotine dependence: Secondary | ICD-10-CM | POA: Insufficient documentation

## 2017-04-08 DIAGNOSIS — I119 Hypertensive heart disease without heart failure: Secondary | ICD-10-CM | POA: Insufficient documentation

## 2017-04-08 DIAGNOSIS — I129 Hypertensive chronic kidney disease with stage 1 through stage 4 chronic kidney disease, or unspecified chronic kidney disease: Secondary | ICD-10-CM | POA: Diagnosis not present

## 2017-04-08 DIAGNOSIS — M79605 Pain in left leg: Secondary | ICD-10-CM | POA: Diagnosis present

## 2017-04-08 DIAGNOSIS — J45909 Unspecified asthma, uncomplicated: Secondary | ICD-10-CM | POA: Insufficient documentation

## 2017-04-08 DIAGNOSIS — Z79899 Other long term (current) drug therapy: Secondary | ICD-10-CM | POA: Diagnosis not present

## 2017-04-08 MED ORDER — MORPHINE SULFATE (PF) 4 MG/ML IV SOLN
4.0000 mg | Freq: Once | INTRAVENOUS | Status: AC
  Administered 2017-04-08: 4 mg via INTRAVENOUS
  Filled 2017-04-08: qty 1

## 2017-04-08 NOTE — ED Provider Notes (Signed)
[facility] DEPT [facility] Provider Note   CSN: [id number] Arrival date & time: 04/08/17  1203     History   Chief Complaint Chief Complaint  Patient presents with  . Fall    HPI Shannon Morse is a 61 y.o. female.  HPI   KATRIANA DORTCH is a 61 y.o. female, with a history of chronic pain, anemia, asthma, COPD, and HTN, presenting to the ED with pain to the lower back and right hip following a fall that occurred shortly prior to arrival. States she was sitting on her porch swing when one of the attachments came loose. The end of the swing on which she was sitting fell down and she fell with it. Landed on the porch surface and did not fall off the porch. Complains of lower back pain, worse on right, 9/10, stabbing, radiating down both legs. Denies neuro deficits, changes in bowel or bladder function, head injury, LOC, neck pain, or any other complaints. Patient is a chronic pain patient taking 10 mg oxycodone for pain throughout the day.    Past Medical History:  Diagnosis Date  . Abnormal stress test 06/28/2016  . Allergic rhinitis   . Anemia   . Anginal pain ([place])    with normal coronaries by cath 03/2013  . Anxiety   . Arthritis    "all over my body"  . Asthma   . Barrett's esophagus   . Bruises easily   . Chronic bronchitis ([place]) "3-4 times/year"  . Chronic pain    "hips, neck, back, head" (11/01/2015"  . CKD (chronic kidney disease), stage IV ([place])    pt has never been told this; does not see kidney doctor (11/01/2015)  . Constipation due to pain medication   . COPD (chronic obstructive pulmonary disease) ([place])   . Coronary artery disease    normal coronaries by 03/2013 cath  . Daily headache   . DDD (degenerative disc disease)   . Depression   . DJD (degenerative joint disease)   . Essential hypertension 06/28/2016  . Essential hypertension 06/28/2016  . Gastritis   . GERD (gastroesophageal reflux disease)   . HSV (herpes simplex virus) infection   . Insomnia   .  Mitral regurgitation    mild to moderate by echo 06/2013  . Neuromuscular disorder ([place])   . Obesity   . On home oxygen therapy    "2L; 24/7" (11/01/2015)  . Osteoporosis   . Osteoporosis   . Pneumonia "several times"  . Restless leg syndrome   . Seasonal allergies   . Shortness of breath    with exertion; cannot lay flat  . Sleep apnea    does not wear machine  (11/01/2015)  . Tobacco abuse   . Vitamin B12 deficiency     Patient Active Problem List   Diagnosis Date Noted  . Abnormal nuclear stress test 06/28/2016  . Essential hypertension 06/28/2016  . COPD (chronic obstructive pulmonary disease) ([place]) 11/01/2015  . Acute exacerbation of COPD with asthma ([place]) 11/01/2015  . COPD exacerbation ([place])   . Cerebral meningioma ([place]) 09/08/2014  . Cervical spondylosis with radiculopathy 09/28/2013  . Unstable angina ([place]) 03/29/2013  . DM (diabetes mellitus) ([place]) 03/29/2013  . Obesity 03/29/2013  . History of tobacco abuse 03/29/2013  . Family history of early CAD 03/29/2013    Past Surgical History:  Procedure Laterality Date  . ABDOMINAL HYSTERECTOMY  1986   "partial"  . ANKLE FRACTURE SURGERY Left 2010  . ANTERIOR CERVICAL DECOMP/DISCECTOMY FUSION  2010   ; C5-C6 anterior discectomy with fusion  . ANTERIOR CERVICAL DECOMP/DISCECTOMY FUSION N/A 09/28/2013   Procedure: CERVICAL FOUR-FIVE, CERVICAL SIX-SEVEN ANTERIOR CERVICAL DECOMPRESSION/DISCECTOMY FUSION 2 LEVELS;  Surgeon: Faythe Ghee, MD;  Location: [facility];  Service: Neurosurgery;  Laterality: N/A;  . BACK SURGERY    . BRAIN SURGERY    . CARDIAC CATHETERIZATION  2009  . CARDIAC CATHETERIZATION N/A 07/05/2016   Procedure: Left Heart Cath and Coronary Angiography;  Surgeon: Jettie Booze, MD;  Location: [facility] LAB;  Service: Cardiovascular;  Laterality: N/A;  . CARPAL TUNNEL RELEASE Bilateral   . CATARACT EXTRACTION W/ INTRAOCULAR LENS  IMPLANT, BILATERAL Bilateral   . COLONOSCOPY    . CRANIOTOMY  Right 09/08/2014   Procedure: Right Frontal Craniotomy w/Curve;  Surgeon: Faythe Ghee, MD;  Location: [facility];  Service: Neurosurgery;  Laterality: Right;  Right Frontal Craniotomy w/Curve  . CYSTOSCOPY VAGINOSCOPY W/ VAGINAL DILATION    . ESOPHAGOGASTRODUODENOSCOPY (EGD) WITH ESOPHAGEAL DILATION  "several times"  . ESOPHAGOGASTRODUODENOSCOPY ENDOSCOPY    . FRACTURE SURGERY    . LAPAROSCOPIC CHOLECYSTECTOMY    . LEFT HEART CATHETERIZATION WITH CORONARY ANGIOGRAM N/A 03/29/2013   Procedure: LEFT HEART CATHETERIZATION WITH CORONARY ANGIOGRAM;  Surgeon: Lorretta Harp, MD;  Location[facility] C[facility] LAB;  Service: Cardiovascular;  Laterality: N/A;  . TEE WITHOUT CARDIOVERSION N/A 07/19/2013   Procedure: TRANSESOPHAGEAL ECHOCARDIOGRAM (TEE);  Surgeon: Sanda Klein, MD;  Location: [facility] ENDOSCOPY;  Service: Cardiovascular;  Laterality: N/A;  . TONSILLECTOMY      OB History    No data available       Home Medications    Prior to Admission medications   Medication Sig Start Date End Date Taking? Authorizing Provider  albuterol (PROVENTIL HFA;VENTOLIN HFA) 108 (90 BASE) MCG/ACT inhaler Inhale 2 puffs into the lungs every 6 (six) hours as needed for wheezing or shortness of breath.    [provider]  albuterol (PROVENTIL) (2.5 MG/3ML) 0.083% nebulizer solution Take 2.5 mg by nebulization every 4 (four) hours as needed for wheezing or shortness of breath.     [provider]  aspirin EC 81 MG tablet Take 81 mg by mouth daily.     [provider]  budesonide-formoterol (SYMBICORT) 160-4.5 MCG/ACT inhaler Inhale 2 puffs into the lungs 2 (two) times daily.    [provider]  busPIRone (BUSPAR) 15 MG tablet Take 30 mg by mouth 2 (two) times daily.     [provider]  cefUROXime (CEFTIN) 500 MG tablet Take 1 tablet (500 mg total) by mouth 2 (two) times daily with a meal. Patient not taking: Reported on 07/01/2016 11/05/15   Modena Jansky, MD    citalopram (CELEXA) 20 MG tablet Take 20 mg by mouth daily.    [provider]  cyclobenzaprine (FLEXERIL) 10 MG tablet Take 10 mg by mouth at bedtime.    [provider]  fluticasone (FLONASE) 50 MCG/ACT nasal spray Place 1 spray into both nostrils daily as needed for allergies.     [provider]  gabapentin (NEURONTIN) 400 MG capsule Take 400 mg by mouth at bedtime.    [provider]  Linaclotide Rolan Lipa) 145 MCG CAPS capsule Take 145 mcg by mouth daily as needed (constipation).     [provider]  lisinopril (PRINIVIL,ZESTRIL) 2.5 MG tablet Take 2.5 mg by mouth daily.    [provider]  loratadine (CLARITIN) 10 MG tablet Take 10 mg by mouth daily.  [provider]  montelukast (SINGULAIR) 10 MG tablet Take 10 mg by mouth every morning.    [provider]  oxyCODONE (ROXICODONE) 15 MG immediate release tablet Take 1 tablet (15 mg total) by mouth 3 (three) times daily as needed for pain. Patient taking differently: Take 10 mg by mouth 3 (three) times daily as needed for pain.  11/05/15   Hongalgi, Lenis Dickinson, MD  oxyCODONE-acetaminophen (PERCOCET) 10-325 MG tablet Take 1 tablet by mouth every 6 (six) hours as needed for pain.    [provider]  pantoprazole (PROTONIX) 40 MG tablet Take 40 mg by mouth 2 (two) times daily. 09/30/15   [provider]  pramipexole (MIRAPEX) 1 MG tablet Take 1 mg by mouth at bedtime.    [provider]  predniSONE (DELTASONE) 10 MG tablet Take 5 tablets (50 mg total) by mouth daily with breakfast. Taper down by 10 mg every 2 days to discontinue. Patient not taking: Reported on 07/01/2016 11/05/15   Modena Jansky, MD  promethazine (PHENERGAN) 25 MG tablet Take 25 mg by mouth every 6 (six) hours as needed for nausea or vomiting.    [provider]  simvastatin (ZOCOR) 20 MG tablet Take 20 mg by mouth every evening.    [provider]  traZODone  (DESYREL) 100 MG tablet Take 200 mg by mouth at bedtime.    [provider]  varenicline (CHANTIX) 1 MG tablet Take 1 mg by mouth 2 (two) times daily.    [provider]    Family History Family History  Problem Relation Age of Onset  . Coronary artery disease Mother 2       MI  . Heart attack Mother   . Hypertension Mother   . Hyperlipidemia Mother   . Coronary artery disease Father        MI  . Heart failure Father   . Stroke Father   . Hypertension Father   . Hyperlipidemia Father   . Cancer Father   . Coronary artery disease Sister 81       MI    Social History Social History  Substance Use Topics  . Smoking status: Former Smoker    Packs/day: 0.25    Years: 50.00    Types: Cigarettes    Quit date: 05/05/2015  . Smokeless tobacco: Former Systems developer    Types: Snuff    Quit date: 05/21/2012     Comment: 11/01/2015 "I have smoked up to 3-4 packs/day"  . Alcohol use 1.2 oz/week    2 Standard drinks or equivalent per week     Comment: occ.     Allergies   Aspirin; Avelox [moxifloxacin]; Okra; Sulfa antibiotics; and Strawberry extract   Review of Systems Review of Systems  Respiratory: Negative for shortness of breath.   Cardiovascular: Negative for chest pain.  Gastrointestinal: Negative for abdominal pain, nausea and vomiting.  Genitourinary: Negative for difficulty urinating.  Musculoskeletal: Positive for arthralgias and back pain. Negative for neck pain.  Neurological: Negative for dizziness, syncope, weakness, light-headedness, numbness and headaches.  All other systems reviewed and are negative.    Physical Exam Updated Vital Signs BP (!) 150/91 (BP Location: Left Arm)   Pulse 69   Temp 98.4 F (36.9 C) (Oral)   Resp 18   Ht 5\' 4"  (1.626 m)   Wt 86.2 kg (190 lb)   SpO2 98%   BMI 32.61 kg/m   Physical Exam  Constitutional: She appears well-developed and well-nourished. No distress.  HENT:  Head: Normocephalic and atraumatic.    Eyes: Conjunctivae and EOM are normal. Pupils are equal, round, and reactive to light.  Neck: Normal range of motion. Neck supple.  Cardiovascular: Normal rate, regular rhythm, normal heart sounds and intact distal pulses.   Pulmonary/Chest: Effort normal. No respiratory distress.  Patient is on 3 LPM O2 [place] at baseline.   Abdominal: Soft. There is no tenderness. There is no guarding.  Musculoskeletal: She exhibits no edema.  Tenderness to bilateral lumbar musculature, including midline. No noted deformity, crepitus, step off, or swelling. Tenderness to right lateral hip without noted deformity, shortening, swelling, wounds, instability, or crepitus. Normal motor function intact in all extremities and spine. No midline spinal tenderness.   Neurological: She is alert.  No acute sensory deficits. Strength 5/5 in all extremities. Antalgic gait (tested following pain management and clear xrays). Coordination intact including heel to shin and finger to nose. Cranial nerves III-XII grossly intact.   Skin: Skin is warm and dry. Capillary refill takes less than 2 seconds. She is not diaphoretic.  Psychiatric: She has a normal mood and affect. Her behavior is normal.  Nursing note and vitals reviewed.    ED Treatments / Results  Labs (all labs ordered are listed, but only abnormal results are displayed) Labs Reviewed - No data to display  EKG  EKG Interpretation None       Radiology Dg Thoracic Spine 2 View  Result Date: 04/08/2017 CLINICAL DATA:  61 y/o F; fall with midline spinal tenderness and right hip tenderness. EXAM: THORACIC SPINE 2 VIEWS COMPARISON:  None. FINDINGS: There is no evidence of thoracic spine fracture. Alignment is normal. Mild-to-moderate discogenic degenerative changes of the thoracic spine with disc space narrowing and small marginal osteophytes. C4-5 and C6-7 anterior spinal fusion noted. IMPRESSION: 1. No acute fracture or malalignment identified. 2.  Mild-to-moderate thoracic spine spondylosis. Electronically Signed   By: Kristine Garbe M.D.   On: 04/08/2017 14:19   Dg Lumbar Spine Complete  Result Date: 04/08/2017 CLINICAL DATA:  Low back pain, right hip tenderness EXAM: LUMBAR SPINE - COMPLETE 4+ VIEW COMPARISON:  MR lumbar spine 05/03/2015 FINDINGS: There are 5 nonrib bearing lumbar-type vertebral bodies. There is no acute vertebral body fracture. There is a chronic L3 vertebral body compression fracture with approximately 40% anterior height loss. There is a chronic mild T12 vertebral body compression fracture with approximately 15% anterior height loss. There is no static listhesis. There is no spondylolysis. There is no acute fracture. The disc spaces are maintained. The SI joints are unremarkable. There is abdominal aortic atherosclerosis. IMPRESSION: No acute osseous injury of the lumbar spine. Electronically Signed   By: Kathreen Devoid   On: 04/08/2017 14:21   Dg Hips Bilat W Or Wo Pelvis 3-4 Views  Result Date: 04/08/2017 CLINICAL DATA:  Recent fall with hip pain, initial encounter EXAM: DG HIP (WITH OR WITHOUT PELVIS) 4V BILAT COMPARISON:  None. FINDINGS: The pelvic ring is intact. Degenerative changes of the hip joints are noted bilaterally. No acute fracture or dislocation is noted. No gross soft tissue abnormality is seen. IMPRESSION: Degenerative changes of the hip joints without acute abnormality. Electronically Signed   By: Inez Catalina M.D.   On: 04/08/2017 14:19    Procedures Procedures (including critical care time)  Medications Ordered in ED Medications  morphine 4 MG/ML injection 4 mg (4 mg Intravenous Given 04/08/17 1335)     Initial Impression / Assessment and Plan / ED Course  I have  reviewed the triage vital signs and the nursing notes.  Pertinent labs & imaging results that were available during my care of the patient were reviewed by me and considered in my medical decision making (see chart for  details).  Clinical Course as of Apr 09 1145  Tue Apr 08, 2017  1451 Delivered xray results. Current pain 2/10.  [SJ]    Clinical Course User Index [SJ] Felicitas Sine C, PA-C    Patient presents for evaluation following a low level fall. No acute abnormalities on xray. No noted neuro or functional deficits. The initially noted pain radiating down bilateral lower extremities resolved shortly after arrival and did not recur. Ambulated without assistance or difficulty. Orthopedic vs PCP follow up. The patient was given instructions for home care as well as return precautions. Patient voices understanding of these instructions, accepts the plan, and is comfortable with discharge.   Vitals:   04/08/17 1205 04/08/17 1206 04/08/17 1319 04/08/17 1415  BP: (!) 150/91   124/65  Pulse: 69   85  Resp: 18   18  Temp: 98.4 F (36.9 C)     TempSrc: Oral     SpO2: 98%  100% 97%  Weight:  86.2 kg (190 lb)    Height:  5\' 4"  (1.626 m)       Final Clinical Impressions(s) / ED Diagnoses   Final diagnoses:  Fall, initial encounter    New Prescriptions Discharge Medication List as of 04/08/2017  3:24 PM       Lorayne Bender, PA-C 04/09/17 I[place], Helane Gunther, PA-C 04/09/17 [place], [place], DO 04/09/17 1346

## 2017-04-08 NOTE — Discharge Instructions (Signed)
You have been evaluated following a fall. Your xrays showed no acute significant or abnormalities. Follow-up with your primary care provider on this matter as soon as possible. Physical therapy or other further evaluation may be needed. May use your home pain medications for pain management.

## 2017-04-08 NOTE — ED Triage Notes (Signed)
Per EMS: pt was sitting on a swing and it fell.  Reports right wrist pain and bilateral lower back pain.  Denies urinary incontinence.

## 2017-05-08 ENCOUNTER — Other Ambulatory Visit: Payer: Self-pay | Admitting: Neurosurgery

## 2017-05-08 DIAGNOSIS — S32010A Wedge compression fracture of first lumbar vertebra, initial encounter for closed fracture: Secondary | ICD-10-CM

## 2017-05-18 ENCOUNTER — Inpatient Hospital Stay
Admission: RE | Admit: 2017-05-18 | Discharge: 2017-05-18 | Disposition: A | Discharge status: Home / Self Care | Payer: Medicare Other | Source: Ambulatory Visit | Attending: Neurosurgery | Admitting: Neurosurgery

## 2017-05-31 ENCOUNTER — Ambulatory Visit
Admission: RE | Admit: 2017-05-31 | Discharge: 2017-05-31 | Disposition: A | Discharge status: Home / Self Care | Payer: Medicare Other | Source: Ambulatory Visit | Attending: Neurosurgery | Admitting: Neurosurgery

## 2017-05-31 DIAGNOSIS — S32010A Wedge compression fracture of first lumbar vertebra, initial encounter for closed fracture: Secondary | ICD-10-CM

## 2017-11-27 ENCOUNTER — Encounter (HOSPITAL_BASED_OUTPATIENT_CLINIC_OR_DEPARTMENT_OTHER): Payer: Self-pay | Admitting: Emergency Medicine

## 2017-11-27 ENCOUNTER — Emergency Department (HOSPITAL_BASED_OUTPATIENT_CLINIC_OR_DEPARTMENT_OTHER)
Admission: EM | Admit: 2017-11-27 | Discharge: 2017-11-27 | Disposition: A | Discharge status: Home / Self Care | Payer: Medicare Other | Attending: Emergency Medicine | Admitting: Emergency Medicine

## 2017-11-27 ENCOUNTER — Other Ambulatory Visit: Payer: Self-pay

## 2017-11-27 ENCOUNTER — Emergency Department (HOSPITAL_BASED_OUTPATIENT_CLINIC_OR_DEPARTMENT_OTHER): Payer: Medicare Other

## 2017-11-27 DIAGNOSIS — Y92009 Unspecified place in unspecified non-institutional (private) residence as the place of occurrence of the external cause: Secondary | ICD-10-CM | POA: Insufficient documentation

## 2017-11-27 DIAGNOSIS — T2000XA Burn of unspecified degree of head, face, and neck, unspecified site, initial encounter: Secondary | ICD-10-CM | POA: Diagnosis not present

## 2017-11-27 DIAGNOSIS — Y999 Unspecified external cause status: Secondary | ICD-10-CM | POA: Diagnosis not present

## 2017-11-27 DIAGNOSIS — X04XXXA Exposure to ignition of highly flammable material, initial encounter: Secondary | ICD-10-CM | POA: Insufficient documentation

## 2017-11-27 DIAGNOSIS — E1122 Type 2 diabetes mellitus with diabetic chronic kidney disease: Secondary | ICD-10-CM | POA: Insufficient documentation

## 2017-11-27 DIAGNOSIS — Y9389 Activity, other specified: Secondary | ICD-10-CM | POA: Insufficient documentation

## 2017-11-27 DIAGNOSIS — T2010XA Burn of first degree of head, face, and neck, unspecified site, initial encounter: Secondary | ICD-10-CM

## 2017-11-27 DIAGNOSIS — I129 Hypertensive chronic kidney disease with stage 1 through stage 4 chronic kidney disease, or unspecified chronic kidney disease: Secondary | ICD-10-CM | POA: Diagnosis not present

## 2017-11-27 DIAGNOSIS — N184 Chronic kidney disease, stage 4 (severe): Secondary | ICD-10-CM | POA: Insufficient documentation

## 2017-11-27 DIAGNOSIS — Z23 Encounter for immunization: Secondary | ICD-10-CM | POA: Insufficient documentation

## 2017-11-27 DIAGNOSIS — I25110 Atherosclerotic heart disease of native coronary artery with unstable angina pectoris: Secondary | ICD-10-CM | POA: Insufficient documentation

## 2017-11-27 DIAGNOSIS — J449 Chronic obstructive pulmonary disease, unspecified: Secondary | ICD-10-CM | POA: Insufficient documentation

## 2017-11-27 DIAGNOSIS — R0789 Other chest pain: Secondary | ICD-10-CM | POA: Insufficient documentation

## 2017-11-27 DIAGNOSIS — F17210 Nicotine dependence, cigarettes, uncomplicated: Secondary | ICD-10-CM | POA: Diagnosis not present

## 2017-11-27 LAB — CBC WITH DIFFERENTIAL/PLATELET
Basophils Absolute: 0 10*3/uL (ref 0.0–0.1)
Basophils Relative: 1 %
Eosinophils Absolute: 0.1 10*3/uL (ref 0.0–0.7)
Eosinophils Relative: 2 %
HCT: 32.7 % — ABNORMAL LOW (ref 36.0–46.0)
Hemoglobin: 10.7 g/dL — ABNORMAL LOW (ref 12.0–15.0)
Lymphocytes Relative: 37 %
Lymphs Abs: 1.9 10*3/uL (ref 0.7–4.0)
MCH: 29.6 pg (ref 26.0–34.0)
MCHC: 32.7 g/dL (ref 30.0–36.0)
MCV: 90.6 fL (ref 78.0–100.0)
Monocytes Absolute: 0.5 10*3/uL (ref 0.1–1.0)
Monocytes Relative: 11 %
Neutro Abs: 2.5 10*3/uL (ref 1.7–7.7)
Neutrophils Relative %: 49 %
Platelets: 217 10*3/uL (ref 150–400)
RBC: 3.61 MIL/uL — ABNORMAL LOW (ref 3.87–5.11)
RDW: 13.2 % (ref 11.5–15.5)
WBC: 5.1 10*3/uL (ref 4.0–10.5)

## 2017-11-27 LAB — BASIC METABOLIC PANEL
Anion gap: 9 (ref 5–15)
BUN: 12 mg/dL (ref 6–20)
CO2: 30 mmol/L (ref 22–32)
Calcium: 9.1 mg/dL (ref 8.9–10.3)
Chloride: 97 mmol/L — ABNORMAL LOW (ref 101–111)
Creatinine, Ser: 0.67 mg/dL (ref 0.44–1.00)
GFR calc Af Amer: >60 mL/min (ref >60)
GFR calc non Af Amer: >60 mL/min (ref >60)
Glucose, Bld: 88 mg/dL (ref 65–99)
Potassium: 3.7 mmol/L (ref 3.5–5.1)
Sodium: 136 mmol/L (ref 135–145)

## 2017-11-27 LAB — TROPONIN I: Troponin I: <0.03 ng/mL (ref <0.03)

## 2017-11-27 MED ORDER — BACITRACIN ZINC 500 UNIT/GM EX OINT: 1.0000 "application " | TOPICAL_OINTMENT | Freq: Two times a day (BID) | CUTANEOUS | 0 refills | Status: DC

## 2017-11-27 MED ORDER — OXYCODONE HCL 5 MG PO TABS
10.0000 mg | ORAL_TABLET | Freq: Once | ORAL | Status: AC
  Administered 2017-11-27: 10 mg via ORAL
  Filled 2017-11-27: qty 2

## 2017-11-27 MED ORDER — TETANUS-DIPHTH-ACELL PERTUSSIS 5-2.5-18.5 LF-MCG/0.5 IM SUSP
0.5000 mL | Freq: Once | INTRAMUSCULAR | Status: AC
  Administered 2017-11-27: 0.5 mL via INTRAMUSCULAR
  Filled 2017-11-27: qty 0.5

## 2017-11-27 MED ORDER — IPRATROPIUM-ALBUTEROL 0.5-2.5 (3) MG/3ML IN SOLN
3.0000 mL | Freq: Once | RESPIRATORY_TRACT | Status: AC
  Administered 2017-11-27: 3 mL via RESPIRATORY_TRACT
  Filled 2017-11-27: qty 3

## 2017-11-27 NOTE — Discharge Instructions (Signed)
Apply antibiotic ointment twice daily.  You may apply antibiotic ointment onto the tip of a Q-tip and then apply the medication in the nose. Use humidified air for comfort.  Follow-up with your primary care physician for reevaluation of your symptoms.  Return to the emergency department if any concerning signs or symptoms develop.

## 2017-11-27 NOTE — ED Notes (Signed)
Pt on monitor 
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
## 2017-11-27 NOTE — ED Notes (Signed)
Quick check CO level with Masimo Pulse OX CO reading at 1%, SpO2 100%
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
## 2018-09-14 IMAGING — CR DG CHEST 2V
2 series · 2 of 2 positions shown · non-contrast
Comparison: July 16, 2016

CLINICAL DATA: Chest pain

EXAM:
CHEST  2 VIEW

[w chest pa]
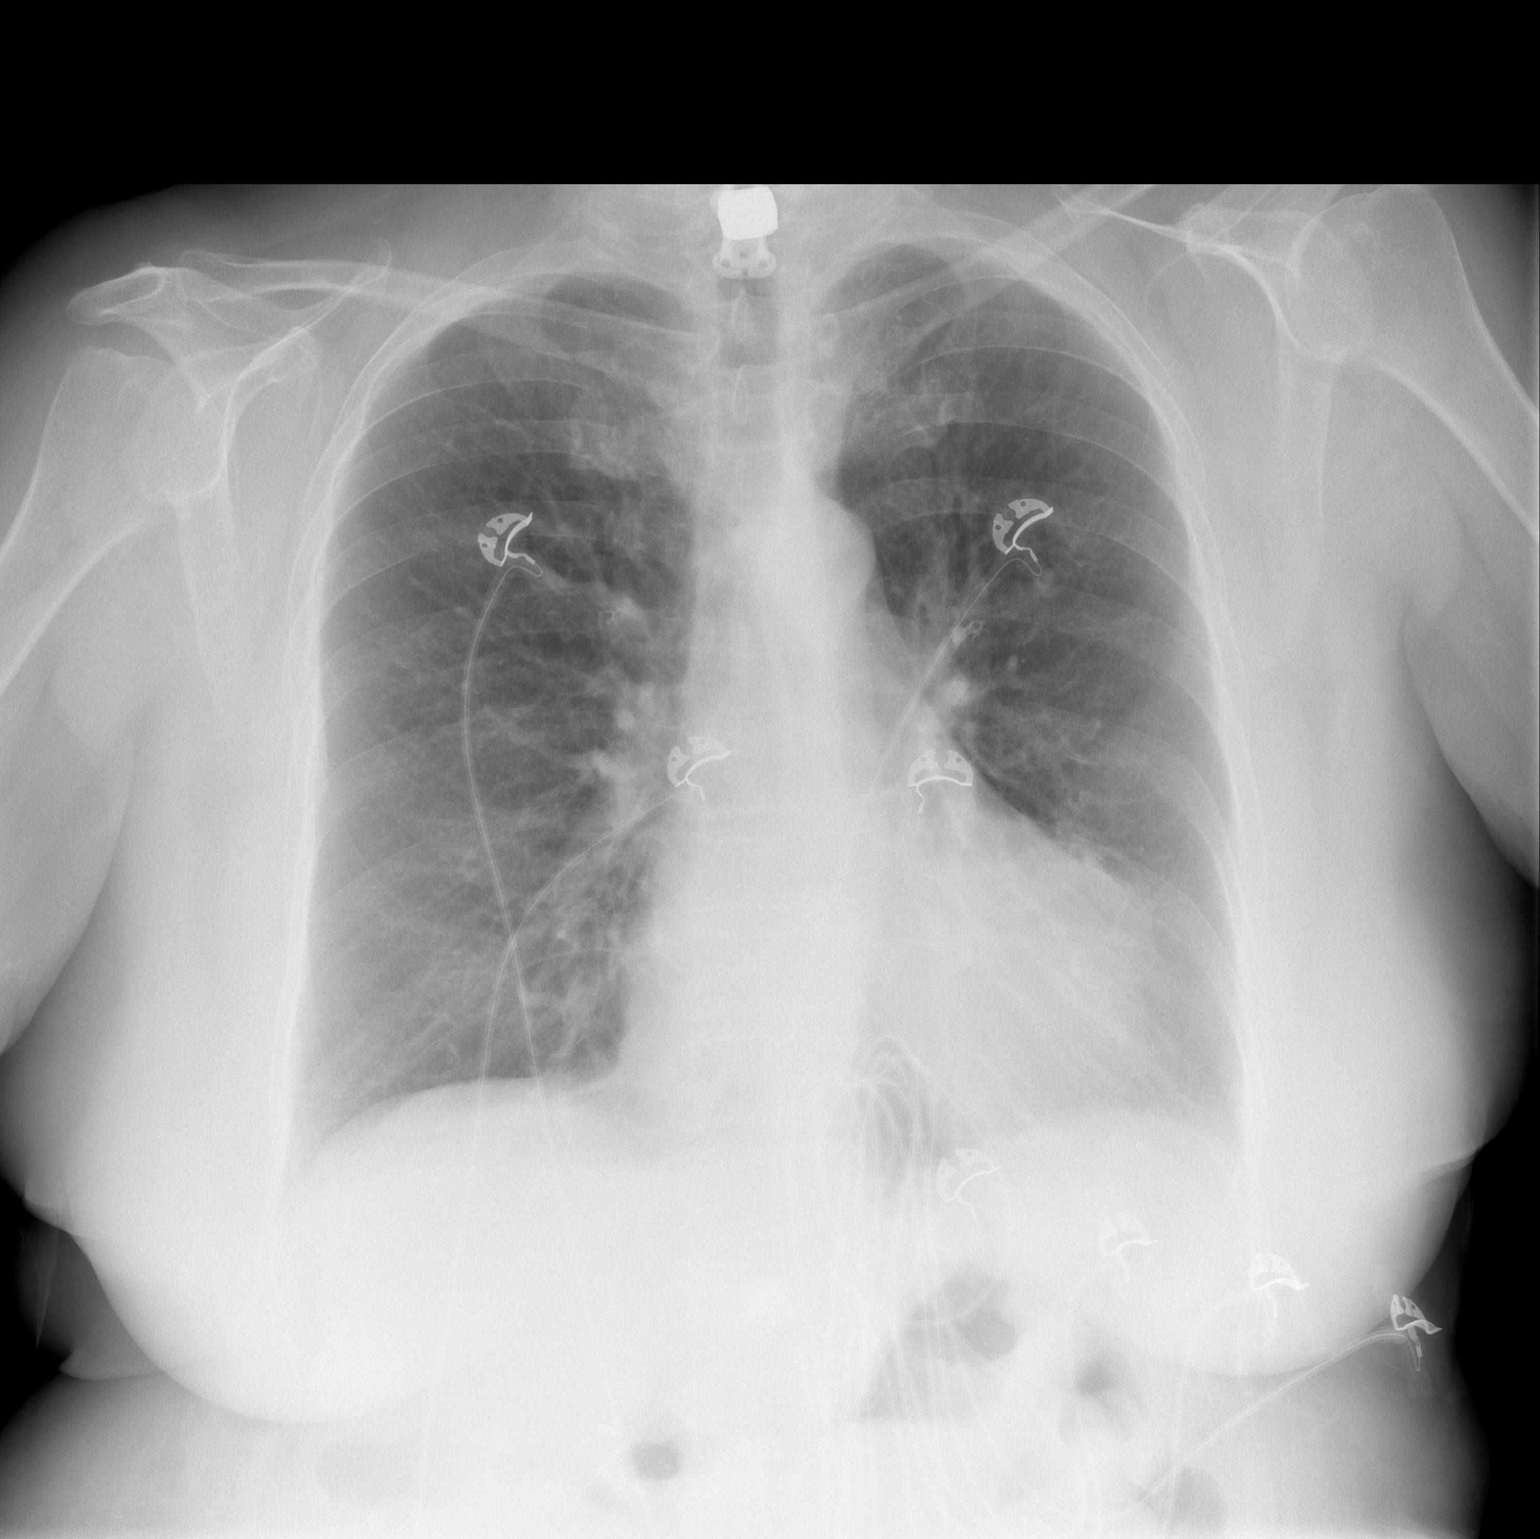

[w chest lat]
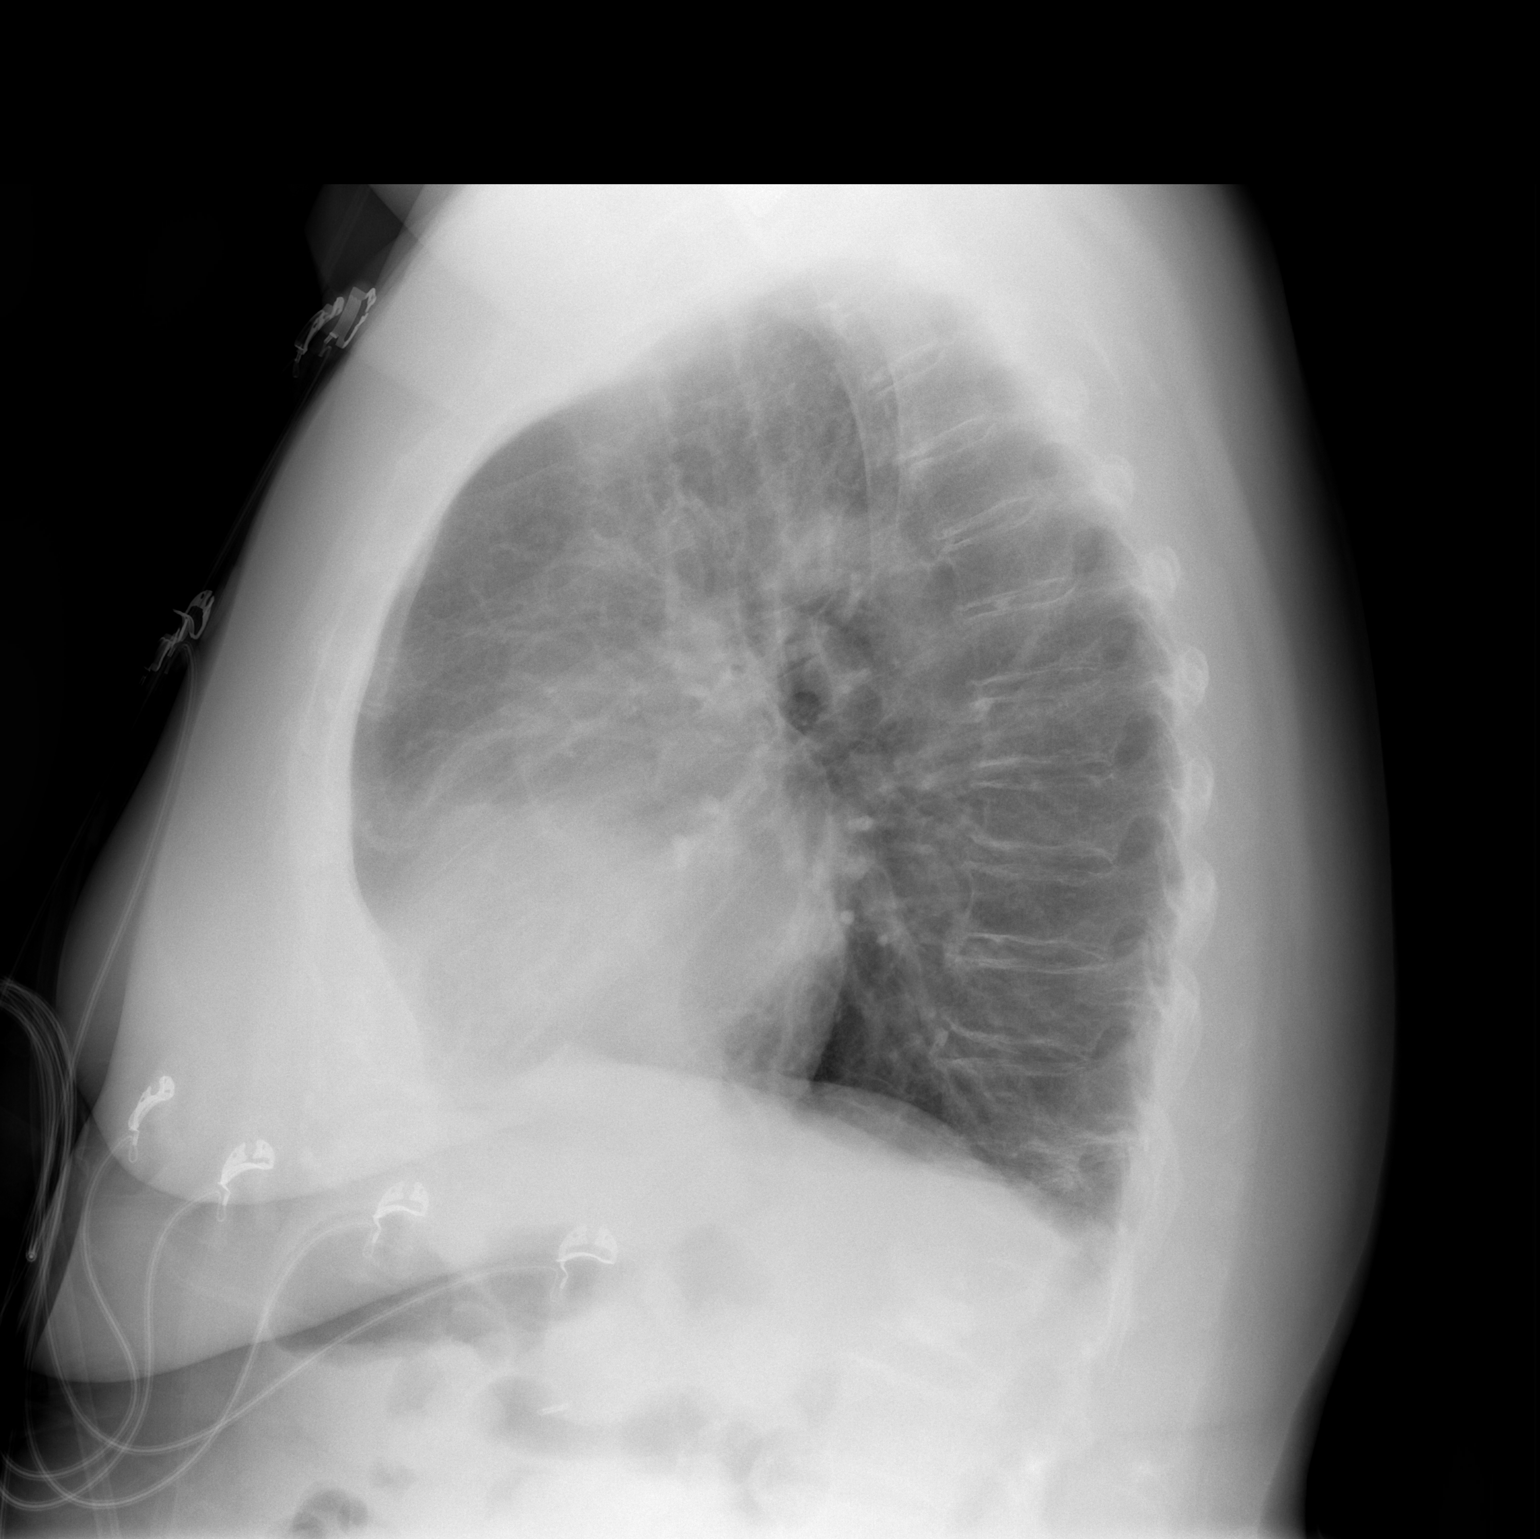

[2 of 2 positions shown; findings below may reference images not displayed]

FINDINGS: The heart size and mediastinal contours are within normal limits.
There is no focal infiltrate, pulmonary edema, or pleural effusion.
The visualized skeletal structures are stable.
IMPRESSION: No active cardiopulmonary disease.

## 2024-02-19 DEATH — deceased
